# Patient Record
Sex: Female | Born: 1960 | Race: White | Hispanic: No | Marital: Married | Smoking: Former smoker
Health system: Southern US, Community
[De-identification: ages and names within clinical notes are randomized; demographics above are authoritative.]

## PROBLEM LIST (undated history)

## (undated) DIAGNOSIS — K635 Polyp of colon: Secondary | ICD-10-CM

## (undated) DIAGNOSIS — J309 Allergic rhinitis, unspecified: Secondary | ICD-10-CM

## (undated) DIAGNOSIS — E785 Hyperlipidemia, unspecified: Secondary | ICD-10-CM

## (undated) HISTORY — DX: Hyperlipidemia, unspecified: E78.5

## (undated) HISTORY — PX: POLYPECTOMY: SHX149

## (undated) HISTORY — DX: Allergic rhinitis, unspecified: J30.9

## (undated) HISTORY — PX: OOPHORECTOMY: SHX86

## (undated) HISTORY — PX: CYSTECTOMY: SUR359

## (undated) HISTORY — DX: Polyp of colon: K63.5

## (undated) HISTORY — PX: OTHER SURGICAL HISTORY: SHX169

## (undated) HISTORY — PX: COLONOSCOPY: SHX174

---

## 2000-12-30 ENCOUNTER — Other Ambulatory Visit: Admission: RE | Admit: 2000-12-30 | Discharge: 2000-12-30 | Payer: Self-pay | Admitting: Obstetrics and Gynecology

## 2001-09-03 ENCOUNTER — Encounter: Payer: Self-pay | Admitting: Internal Medicine

## 2001-09-03 ENCOUNTER — Encounter: Admission: RE | Admit: 2001-09-03 | Discharge: 2001-09-03 | Payer: Self-pay | Admitting: Internal Medicine

## 2001-10-10 ENCOUNTER — Encounter (INDEPENDENT_AMBULATORY_CARE_PROVIDER_SITE_OTHER): Payer: Self-pay | Admitting: Specialist

## 2001-10-10 ENCOUNTER — Observation Stay (HOSPITAL_COMMUNITY): Admission: AD | Admit: 2001-10-10 | Discharge: 2001-10-11 | Payer: Self-pay | Admitting: Obstetrics and Gynecology

## 2001-10-10 ENCOUNTER — Encounter: Payer: Self-pay | Admitting: Emergency Medicine

## 2001-10-10 ENCOUNTER — Encounter: Payer: Self-pay | Admitting: Obstetrics and Gynecology

## 2002-04-04 ENCOUNTER — Other Ambulatory Visit: Admission: RE | Admit: 2002-04-04 | Discharge: 2002-04-04 | Payer: Self-pay | Admitting: Obstetrics and Gynecology

## 2002-09-06 ENCOUNTER — Encounter: Admission: RE | Admit: 2002-09-06 | Discharge: 2002-09-06 | Payer: Self-pay | Admitting: Obstetrics and Gynecology

## 2002-09-06 ENCOUNTER — Encounter: Payer: Self-pay | Admitting: Obstetrics and Gynecology

## 2003-05-17 ENCOUNTER — Other Ambulatory Visit: Admission: RE | Admit: 2003-05-17 | Discharge: 2003-05-17 | Payer: Self-pay | Admitting: Obstetrics and Gynecology

## 2004-08-19 ENCOUNTER — Other Ambulatory Visit: Admission: RE | Admit: 2004-08-19 | Discharge: 2004-08-19 | Payer: Self-pay | Admitting: Obstetrics and Gynecology

## 2005-08-20 ENCOUNTER — Other Ambulatory Visit: Admission: RE | Admit: 2005-08-20 | Discharge: 2005-08-20 | Payer: Self-pay | Admitting: Obstetrics and Gynecology

## 2007-11-29 ENCOUNTER — Encounter: Admission: RE | Admit: 2007-11-29 | Discharge: 2007-11-29 | Payer: Self-pay | Admitting: Internal Medicine

## 2007-12-03 ENCOUNTER — Encounter: Admission: RE | Admit: 2007-12-03 | Discharge: 2007-12-03 | Payer: Self-pay | Admitting: Internal Medicine

## 2008-12-04 ENCOUNTER — Encounter: Admission: RE | Admit: 2008-12-04 | Discharge: 2008-12-04 | Payer: Self-pay | Admitting: Obstetrics and Gynecology

## 2008-12-08 ENCOUNTER — Encounter: Admission: RE | Admit: 2008-12-08 | Discharge: 2008-12-08 | Payer: Self-pay | Admitting: Obstetrics and Gynecology

## 2010-02-05 ENCOUNTER — Encounter: Admission: RE | Admit: 2010-02-05 | Discharge: 2010-02-05 | Payer: Self-pay | Admitting: Obstetrics and Gynecology

## 2010-08-04 ENCOUNTER — Encounter: Payer: Self-pay | Admitting: Internal Medicine

## 2010-08-05 ENCOUNTER — Encounter: Payer: Self-pay | Admitting: Obstetrics and Gynecology

## 2010-11-29 NOTE — H&P (Signed)
[facility] of [facility]  Patient:    Miranda Guerra, Miranda Guerra Visit Number: [id number] MRN: [id number]          Service Type: GYN Attending Physician:  Jaymes Graff A Dictated by:   Pierre Bali Normand Sloop, M.D. Admit Date:  10/10/2001 Discharge Date: 10/11/2001                           History and Physical  DATE OF BIRTH:                05/11/1961.  HISTORY OF PRESENT ILLNESS:   The patient is a 50 year old female, G0, last menstrual period was October 02, 2001 who presented to maternity admissions after [facility] secondary to right flank pain that is intermittent, what felt like a kidney stone to her.  The patient says that she has had kidney stones before and has been taking some oral calcium and there was an increased chance.  She said the pain is intermittent and has been having several over the last two days.  Denies any UTI symptoms.  No nausea, vomiting, fever, or chills.  The pain was relieved by Dilaudid given at [facility].  PAST MEDICAL HISTORY:         Unremarkable.  PAST SURGICAL HISTORY:        Unremarkable.  ALLERGIES:                    No known drug allergies.  PAST GYNECOLOGIC HISTORY:     Significant for menarche at age 19 occurring every month, lasting for three to four days.  She has no history of sexually transmitted disease or abnormal Pap smear.  The patient is not currently sexually active.  FAMILY HISTORY:               Significant for breast cancer with a maternal aunt and hypertension in her mother.  SOCIAL HISTORY:               Negative for tobacco, occasional alcohol, and no drug use.  PHYSICAL EXAMINATION:  VITAL SIGNS:                  Temperature is 98.1, respiratory rate is 16, blood pressure is 124/79, pulse is 70.  GENERAL:                      The patient is in no apparent distress.  HEART:                        Regular.  LUNGS:                        Clear.  ABDOMEN:                       Soft and nontender.  BACK:                         She does have some right CVA tenderness.  No left CVA tenderness.  PELVIC:                       Her vulvovaginal is within normal limits.  Her cervix is normal in appearance without any lesions.  No cervical motion tenderness.  Uterus is normal size and nontender.  She does have some right adnexal fullness and tenderness.  Left adnexa is nontender.  EXTREMITIES:                  No clubbing, cyanosis, or edema bilaterally.  LABORATORY DATA:              Her UA is negative.  Urine HCG is negative.  CT of the abdomen and pelvis show no kidney stones but there was a large ovarian cyst that was consistent with a dermoid measuring 4.5 by 6.4 cm and her ovary was slightly enlarged measuring 3.3 cm by 6 cm.  Left ovary was normal and that is the report from the fellow at [facility].  ASSESSMENT/PLAN:              The patient has a right adnexal mass, possible ovarian torsion.  The patient is hesitant about proceeding with surgery.  The risks of ovarian torsion I explained to the patient that she may lose her ovary.  The patient then decided that she would agree to an ultrasound to see if there is any doppler flow to the ovary and to get more detail about the ovary cyst.  The patient agreed with the plan and we will re-evaluate the situation after the ultrasound. Dictated by:   Pierre Bali. Normand Sloop, M.D. Attending Physician:  Michael Litter DD:  10/10/01 TD:  10/10/01 Job: [id number]

## 2010-11-29 NOTE — Op Note (Signed)
[facility] of [facility]  Patient:    Miranda Guerra, Miranda Guerra Visit Number: [id number] MRN: [id number]          Service Type: OBS Location: [id number] Attending Physician:  Jaymes Graff A Dictated by:   Pierre Bali Normand Sloop, M.D. Proc. Date: 10/10/01 Admit Date:  10/10/2001                             Operative Report  PREOPERATIVE DIAGNOSIS:       Ovarian cyst with intermittent torsion.  POSTOPERATIVE DIAGNOSIS:      Torsed right ovary and tube.  OPERATION:                    Laparoscopic right salpingo-oophorectomy and lysis of adhesions.  ANESTHESIA:                   General endotracheal tube.  SURGEON:                      Naima A. Normand Sloop, M.D.  ASSISTANT:                    Janine Limbo, M.D.  ESTIMATED BLOOD LOSS:         Minimal.  INTRAVENOUS FLUIDS:           2000 cc crystalloid.  URINE OUTPUT:                 150 cc clear urine.  FINDINGS:                     Normal sized uterus and left tube and ovary, normal-appearing. The patient had a large right torsed ovary and tube. They were both hemorrhagic and necrotic and edematous. There was also a large dermoid cyst attached to the necrotic ovary. The entire complex measured 7 to 8 cm. She had normal-appearing abdominal anatomy, normal appendix. There was a left sigmoid adhesion to a left uterosacral ligament, and patient went to recovery room in stable condition.  PATIENT IDENTIFICATION:       The patient was taken to the operating room where she was given general anesthesia, placed in dorsal lithotomy position, and prepped and draped in a normal sterile fashion. Foley catheter was placed in the bladder. A bivalve speculum was placed into the vagina. The anterior lip of the cervix was grasped with a single-tooth tenaculum. The ______ was placed on the ______ to manipulate the uterus. Attention was then turned to the patients abdomen where a 10-mm infraumbilical vertical incision was made with  the knife and Veress needle was placed at a 45-degree angle while tenting the abdominal wall. Intraabdominal placement was confirmed by fluid-filled syringe and decrease in CO2 pressure. Abdomen was insufflated about 3.5 L, CO2 gas. A 10-mm trocar was placed without difficulty and the findings noted above were seen. A total of 3 cc of Marcaine was placed in the right lower quadrant and a 5-mm trocar was placed under direct visualization with a laparoscope. Pelvic washings were then obtained. A total of 3 cc Marcaine were placed in the left lower quadrant and the 5-mm trocar was placed under the direct visualization of the laparoscope. Hemostasis were assured. Pelvic washings were obtained. The ureter on the right side was found to be far enough away from the infundibulopelvic ligament to dissect the right tube from the ovary and there were no adhesions in the  way. The ovary was then un-torsed; it still was necrotic, needed to be removed. The patients round ligament was grasped, cauterized and cut, then the uteroovarian ligaments were grasped, cauterized, and cut. The ureter again was looked to make sure that it was out of the way. Two Endoloops were placed on the infundibulopelvic ligament, and they were ligated x2. The ovary and tube were then cut away using scissors. The ureter was noted to be intact. The 5-mm trocar was then extended on the left to a 10-mm site and the 10-mm trocar was placed. EndoCatch bag was placed through this trocar. The large ovarian tubal complex was placed into the bag. The bag was then drained with a syringe of thick fluid consistent with a sebaceous material found in a dermoid cyst. The cyst was shrunk enough to place the whole entire bag over the entire complex, and the cyst was then removed through the 10-mm port. The fascia then had to be extended with the knife and hemostasis was noted. With careful traction, the cyst was removed. There was no drainage into  the abdomen. The 10-mm port fascia was cleared with 0 Vicryl in a running fashion. Upon second look, there was no sebaceous material, no spillage from the tuboovarian complex into the pelvis. The pelvis was irrigated and then drained. The infundibulopelvic ligament and the round ligament pedicles were noted to be hemostatic. She had a small adhesion going from the sigmoid colon to left uterosacral ligament, which was cauterized then cut. There was no damage noted to the bowel from the cautery or cutting. All instruments were removed under direct visualization with the laparoscope as the air was allowed to leave the abdomen. The fascia and the umbilical port was repaired with 0 Vicryl. The skin was all repaired with 3-0 Vicryl in a subcuticular fashion. The tenaculum, ______ was removed with good hemostasis noted. Sponge, lap and needle counts were correct x2. The patient went to recovery room in stable condition.  Of note, the cyst had the exact appearance of a dermoid. There was no omental study noted. There was no excrescences. There was no indication that this is a malignant tumor. Dictated by:   Pierre Bali. Normand Sloop, M.D. Attending Physician:  Michael Litter DD:  10/10/01 TD:  10/11/01 Job: [id number]

## 2011-02-17 ENCOUNTER — Other Ambulatory Visit: Payer: Self-pay | Admitting: Obstetrics and Gynecology

## 2011-02-17 DIAGNOSIS — Z1231 Encounter for screening mammogram for malignant neoplasm of breast: Secondary | ICD-10-CM

## 2011-02-26 ENCOUNTER — Ambulatory Visit
Admission: RE | Admit: 2011-02-26 | Discharge: 2011-02-26 | Disposition: A | Discharge status: Home / Self Care | Payer: BC Managed Care – PPO | Source: Ambulatory Visit | Attending: Obstetrics and Gynecology | Admitting: Obstetrics and Gynecology

## 2011-02-26 DIAGNOSIS — Z1231 Encounter for screening mammogram for malignant neoplasm of breast: Secondary | ICD-10-CM

## 2012-02-16 ENCOUNTER — Other Ambulatory Visit: Payer: Self-pay | Admitting: Obstetrics and Gynecology

## 2012-02-16 DIAGNOSIS — Z1231 Encounter for screening mammogram for malignant neoplasm of breast: Secondary | ICD-10-CM

## 2012-03-12 ENCOUNTER — Ambulatory Visit: Payer: BC Managed Care – PPO

## 2012-04-01 ENCOUNTER — Ambulatory Visit
Admission: RE | Admit: 2012-04-01 | Discharge: 2012-04-01 | Disposition: A | Discharge status: Home / Self Care | Payer: BC Managed Care – PPO | Source: Ambulatory Visit | Attending: Obstetrics and Gynecology | Admitting: Obstetrics and Gynecology

## 2012-04-01 DIAGNOSIS — Z1231 Encounter for screening mammogram for malignant neoplasm of breast: Secondary | ICD-10-CM

## 2012-04-06 ENCOUNTER — Encounter: Payer: Self-pay | Admitting: Obstetrics and Gynecology

## 2012-06-01 ENCOUNTER — Encounter: Payer: Self-pay | Admitting: Gastroenterology

## 2012-06-14 ENCOUNTER — Encounter: Payer: Self-pay | Admitting: Obstetrics and Gynecology

## 2012-06-14 ENCOUNTER — Ambulatory Visit (INDEPENDENT_AMBULATORY_CARE_PROVIDER_SITE_OTHER): Payer: BC Managed Care – PPO | Admitting: Obstetrics and Gynecology

## 2012-06-14 VITALS — BP 110/62 | Ht 67.0 in | Wt 170.0 lb

## 2012-06-14 DIAGNOSIS — Z01419 Encounter for gynecological examination (general) (routine) without abnormal findings: Secondary | ICD-10-CM

## 2012-06-14 NOTE — Progress Notes (Signed)
Subjective:    Miranda Guerra is a 51 y.o. female G0P0 who presents for annual exam. The patient complaints of weight gain.  The following portions of the patient's history were reviewed and updated as appropriate: allergies, current medications, past family history, past medical history, past social history, past surgical history and problem list.  Review of Systems Pertinent items are noted in HPI. Gastrointestinal:No change in bowel habits, no abdominal pain, no rectal bleeding Genitourinary:negative for dysuria, frequency, hematuria, nocturia and urinary incontinence    Objective:     BP 110/62  Ht 5\' 7"  (1.702 m)  Wt 170 lb (77.111 kg)  BMI 26.63 kg/m2  LMP 05/27/2012  Weight:  Wt Readings from Last 1 Encounters:  06/14/12 170 lb (77.111 kg)     BMI: Body mass index is 26.63 kg/(m^2). General Appearance: Alert, appropriate appearance for age. No acute distress HEENT: Grossly normal Neck / Thyroid: Supple, no masses, nodes or enlargement Lungs: clear to auscultation bilaterally Back: No CVA tenderness Breast Exam: No masses or nodes.No dimpling, nipple retraction or discharge. Cardiovascular: Regular rate and rhythm. S1, S2, no murmur Gastrointestinal: Soft, non-tender, no masses or organomegaly  ++++++++++++++++++++++++++++++++++++++++++++++++++++++++  Pelvic Exam: External genitalia: normal general appearance Vaginal: normal without tenderness, induration or masses Cervix: normal appearance Adnexa: normal bimanual exam Uterus: normal size shape and consistency Rectovaginal: normal rectal, no masses  ++++++++++++++++++++++++++++++++++++++++++++++++++++++++  Lymphatic Exam: Non-palpable nodes in neck, clavicular, axillary, or inguinal regions  Psychiatric: Alert and oriented, appropriate affect.      Assessment:    Normal gyn exam   Overweight or obese: Yes  Pelvic relaxation: No  Menopausal symptoms: No. Severe: No.   Plan:    Pap smear.   I  recommended the patient see a nutritionist.  Follow-up:  for annual exam  The updated Pap smear screening guidelines were discussed with the patient. The patient requested that I obtain a Pap smear: Yes.  Kegel exercises discussed: Yes.  Proper diet and regular exercise were reviewed.  Annual mammograms recommended starting at age 87. Proper breast care was discussed.  Screening colonoscopy is recommended beginning at age 41.  Regular health maintenance was reviewed.  Sleep hygiene was discussed.  Adequate calcium and vitamin D intake was emphasized.  Leonard Schwartz M.D.   Regular Periods: yes Mammogram: yes  Monthly Breast Ex.: no Exercise: yes  Tetanus < 10 years: yes Seatbelts: yes  NI. Bladder Functn.: yes Abuse at home: no  Daily BM's: yes Stressful Work: yes  Healthy Diet: yes Sigmoid-Colonoscopy: never had one   Calcium: yes Medical problems this year: none   LAST PAP:04/24/09 wnl   Contraception: None  Mammogram:  02/17/2012  PCP: [facility]   JWJ:[id number]   FMH: unchanged

## 2012-06-15 LAB — PAP IG W/ RFLX HPV ASCU

## 2012-07-20 ENCOUNTER — Encounter: Payer: BC Managed Care – PPO | Admitting: Gastroenterology

## 2012-09-01 ENCOUNTER — Ambulatory Visit (AMBULATORY_SURGERY_CENTER): Payer: BC Managed Care – PPO | Admitting: *Deleted

## 2012-09-01 VITALS — Ht 68.0 in | Wt 173.2 lb

## 2012-09-01 DIAGNOSIS — Z1211 Encounter for screening for malignant neoplasm of colon: Secondary | ICD-10-CM

## 2012-09-01 MED ORDER — NA SULFATE-K SULFATE-MG SULF 17.5-3.13-1.6 GM/177ML PO SOLN: 1.0000 | Freq: Once | ORAL | Status: DC

## 2012-09-01 NOTE — Progress Notes (Signed)
No egg or soy allergy. No problems with sedation in the past. ewm 

## 2012-09-02 ENCOUNTER — Encounter: Payer: Self-pay | Admitting: Gastroenterology

## 2012-09-10 ENCOUNTER — Ambulatory Visit (AMBULATORY_SURGERY_CENTER): Payer: BC Managed Care – PPO | Admitting: Gastroenterology

## 2012-09-10 ENCOUNTER — Encounter: Payer: Self-pay | Admitting: Gastroenterology

## 2012-09-10 VITALS — BP 122/74 | HR 70 | Temp 98.6°F | Resp 21 | Ht 68.0 in | Wt 178.0 lb

## 2012-09-10 DIAGNOSIS — Z1211 Encounter for screening for malignant neoplasm of colon: Secondary | ICD-10-CM

## 2012-09-10 DIAGNOSIS — D126 Benign neoplasm of colon, unspecified: Secondary | ICD-10-CM

## 2012-09-10 DIAGNOSIS — K5730 Diverticulosis of large intestine without perforation or abscess without bleeding: Secondary | ICD-10-CM

## 2012-09-10 DIAGNOSIS — C187 Malignant neoplasm of sigmoid colon: Secondary | ICD-10-CM

## 2012-09-10 MED ORDER — SODIUM CHLORIDE 0.9 % IV SOLN: 500.0000 mL | INTRAVENOUS | Status: DC

## 2012-09-10 NOTE — Progress Notes (Signed)
Patient did not have preoperative order for IV antibiotic SSI prophylaxis. (G8918)  Patient did not experience any of the following events: a burn prior to discharge; a fall within the facility; wrong site/side/patient/procedure/implant event; or a hospital transfer or hospital admission upon discharge from the facility. (G8907)  

## 2012-09-10 NOTE — Progress Notes (Signed)
Called to room to assist during endoscopic procedure.  Patient ID and intended procedure confirmed with present staff. Received instructions for my participation in the procedure from the performing physician.  

## 2012-09-10 NOTE — Patient Instructions (Addendum)
YOU HAD AN ENDOSCOPIC PROCEDURE TODAY AT THE  ENDOSCOPY CENTER: Refer to the procedure report that was given to you for any specific questions about what was found during the examination.  If the procedure report does not answer your questions, please call your gastroenterologist to clarify.  If you requested that your care partner not be given the details of your procedure findings, then the procedure report has been included in a sealed envelope for you to review at your convenience later.  YOU SHOULD EXPECT: Some feelings of bloating in the abdomen. Passage of more gas than usual.  Walking can help get rid of the air that was put into your GI tract during the procedure and reduce the bloating. If you had a lower endoscopy (such as a colonoscopy or flexible sigmoidoscopy) you may notice spotting of blood in your stool or on the toilet paper. If you underwent a bowel prep for your procedure, then you may not have a normal bowel movement for a few days.  DIET: Your first meal following the procedure should be a light meal and then it is ok to progress to your normal diet.  A half-sandwich or bowl of soup is an example of a good first meal.  Heavy or fried foods are harder to digest and may make you feel nauseous or bloated.  Likewise meals heavy in dairy and vegetables can cause extra gas to form and this can also increase the bloating.  Drink plenty of fluids but you should avoid alcoholic beverages for 24 hours.  ACTIVITY: Your care partner should take you home directly after the procedure.  You should plan to take it easy, moving slowly for the rest of the day.  You can resume normal activity the day after the procedure however you should NOT DRIVE or use heavy machinery for 24 hours (because of the sedation medicines used during the test).    SYMPTOMS TO REPORT IMMEDIATELY: A gastroenterologist can be reached at any hour.  During normal business hours, 8:30 AM to 5:00 PM Monday through Friday,  call [phone].  After hours and on weekends, please call the GI answering service at [phone] who will take a message and have the physician on call contact you.   Following lower endoscopy (colonoscopy or flexible sigmoidoscopy):  Excessive amounts of blood in the stool  Significant tenderness or worsening of abdominal pains  Swelling of the abdomen that is new, acute  Fever of 100F or higher   FOLLOW UP: If any biopsies were taken you will be contacted by phone or by letter within the next 1-3 weeks.  Call your gastroenterologist if you have not heard about the biopsies in 3 weeks.  Our staff will call the home number listed on your records the next business day following your procedure to check on you and address any questions or concerns that you may have at that time regarding the information given to you following your procedure. This is a courtesy call and so if there is no answer at the home number and we have not heard from you through the emergency physician on call, we will assume that you have returned to your regular daily activities without incident.  SIGNATURES/CONFIDENTIALITY: You and/or your care partner have signed paperwork which will be entered into your electronic medical record.  These signatures attest to the fact that that the information above on your After Visit Summary has been reviewed and is understood.  Full responsibility of the confidentiality of   this discharge information lies with you and/or your care-partner.   INFORMATION ON POLYPS GIVEN TO YOU TODAY 

## 2012-09-10 NOTE — Op Note (Signed)
[facility] [address] [place], [address]   COLONOSCOPY PROCEDURE REPORT  PATIENT: Miranda, Guerra  MR#: [id number] BIRTHDATE: [birth year] , 51  yrs. old GENDER: Female ENDOSCOPIST: Louis Meckel, MD REFERRED [id number] Cleda Clarks, M.D. PROCEDURE DATE:  09/10/2012 PROCEDURE:   Colonoscopy with snare polypectomy and Submucosal injection, any substance ASA CLASS:   Class I INDICATIONS:average risk screening. MEDICATIONS: MAC sedation, administered by CRNA and Propofol (Diprivan) 280 mg IV  DESCRIPTION OF PROCEDURE:   After the risks benefits and alternatives of the procedure were thoroughly explained, informed consent was obtained.  A digital rectal exam revealed no abnormalities of the rectum.   The LB [id number]  endoscope was introduced through the anus and advanced to the cecum, which was identified by both the appendix and ileocecal valve. No adverse events experienced.   The quality of the prep was Suprep excellent The instrument was then slowly withdrawn as the colon was fully examined.      COLON FINDINGS: A flat polyp measuring 5 mm in size was found in the ascending colon.  Cold polypectomy  was performed in a piecemeal fashion.    The resection was complete and the polyp tissue was completely retrieved.   A sessile polyp measuring 4 mm in size was found in the descending colon.  A polypectomy was performed with a cold snare.  The resection was complete and the polyp tissue was completely retrieved.   A In the sigmoid colon at approximately 18 cm there was a semi-pedunculated friable, bleeding polyp.  This was removed with hot polypectomy snare and submitted to pathology.  The surrounding area was injected with 2 cc of "spot", submucosally. polyp was found.   Mild diverticulosis was noted in the sigmoid colon.   The colon mucosa was otherwise normal.  Retroflexed views revealed no abnormalities. The time to cecum=3 minutes 37  seconds. Withdrawal time=13 minutes 54 seconds.  The scope was withdrawn and the procedure completed. COMPLICATIONS: There were no complications.  ENDOSCOPIC IMPRESSION: 1.   colon polyps 2.  diverticulosis  RECOMMENDATIONS: If the polyp(s) removed today are proven to be adenomatous (pre-cancerous) polyps, you will need a colonoscopy in 3 years. Otherwise you should continue to follow colorectal cancer screening guidelines for "routine risk" patients with a colonoscopy in 10 years.  You will receive a letter within 1-2 weeks with the results of your biopsy as well as final recommendations.  Please call my office if you have not received a letter after 3 weeks.   eSigned:  Louis Meckel, MD 09/10/2012 10:42 AM   cc:   PATIENT NAME:  Miranda, Guerra MR#: [id number]

## 2012-09-10 NOTE — Progress Notes (Signed)
A/ox3 pleased with MAC report to Penny RN 

## 2012-09-13 ENCOUNTER — Telehealth: Payer: Self-pay | Admitting: *Deleted

## 2012-09-13 NOTE — Telephone Encounter (Signed)
No answer, message left for the patient. 

## 2012-09-22 ENCOUNTER — Telehealth: Payer: Self-pay | Admitting: Gastroenterology

## 2012-09-22 NOTE — Telephone Encounter (Signed)
Left message for pt to call back  °

## 2012-09-22 NOTE — Telephone Encounter (Signed)
Jesusita Oka, Thanks for your assistance.  Rob

## 2012-09-22 NOTE — Telephone Encounter (Signed)
Miranda Guerra, We discussed her case today at multidisciplinary cancer conference.  Surgeons, path, medical oncologists all agreed that she has had definitive therapy for the small cancer within polyp and she does not require further resection.  Close endoscopic surveillance recommended.  Thanks

## 2012-09-22 NOTE — Telephone Encounter (Signed)
Message copied by Chrystie Nose on Wed Sep 22, 2012  9:27 AM ------      Message from: Melvia Heaps D      Created: Wed Sep 22, 2012  9:20 AM       Please ask patient to schedule an office visit to discuss colonoscopy results. Indicated to her that everything is okay but we just need to talk about followup. ------

## 2012-09-23 ENCOUNTER — Telehealth: Payer: Self-pay

## 2012-09-23 NOTE — Telephone Encounter (Signed)
Please ask patient to schedule an office visit to discuss colonoscopy results. Indicated to her that everything is okay but we just need to talk about followup.  ------      Louis Meckel, MD at 09/22/2012 9:19 AM   Pt aware, she knows she needs a follow-up to discuss followup per Dr. Arlyce Dice. Pt scheduled to see Dr. Arlyce Dice 10/01/12@9 :30am. Pt aware of appt date and time.

## 2012-10-01 ENCOUNTER — Encounter: Payer: Self-pay | Admitting: Gastroenterology

## 2012-10-01 ENCOUNTER — Ambulatory Visit (INDEPENDENT_AMBULATORY_CARE_PROVIDER_SITE_OTHER): Payer: BC Managed Care – PPO | Admitting: Gastroenterology

## 2012-10-01 VITALS — BP 110/70 | HR 70 | Wt 171.0 lb

## 2012-10-01 DIAGNOSIS — C187 Malignant neoplasm of sigmoid colon: Secondary | ICD-10-CM

## 2012-10-01 DIAGNOSIS — Z85038 Personal history of other malignant neoplasm of large intestine: Secondary | ICD-10-CM | POA: Insufficient documentation

## 2012-10-01 NOTE — Assessment & Plan Note (Addendum)
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 Case was discussed at tumor conference where the consensus was that polypectomy is sufficient therapy. Surgical resection was not recommended. Because of the pathology increased surveillance will be done, namely, sigmoidoscopy in 3 and in 6 months, and full colonoscopy in one year. This was fully explained to the patient who is in agreement with the proposed surveillance recommendations.

## 2012-10-01 NOTE — Progress Notes (Signed)
History of Present Illness: Pleasant 52 year old white female here following colonoscopy for screening where multiple colon polyps were seen and removed. 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 There was a 1 mm margin between the cancer and the resected tissue. She has no GI complaints. Family history is negative for colon cancer. Father has had recurrent colon polyps.    Past Medical History  Diagnosis Date  . Hyperlipidemia   . Colon polyps   . Allergic rhinitis    Past Surgical History  Procedure Laterality Date  . Cystectomy    . Fallopian tube removal    . Oophorectomy     family history includes Alzheimer's disease in her mother; Breast cancer in her maternal aunt and sister; Colon polyps in her father; Emphysema in her maternal grandfather and paternal grandfather; Heart disease in her maternal grandfather; Hypertension in her mother; and Lung cancer in her paternal grandmother.  There is no history of Colon cancer. Current Outpatient Prescriptions  Medication Sig Dispense Refill  . cholecalciferol (VITAMIN D) 1000 UNITS tablet Take 1,000 Units by mouth 2 (two) times daily.      . fluticasone (FLONASE) 50 MCG/ACT nasal spray Place 2 sprays into the nose daily.      . vitamin E (VITAMIN E) 400 UNIT capsule Take 400 Units by mouth daily.       No current facility-administered medications for this visit.   Allergies as of 10/01/2012  . (No Known Allergies)    reports that she quit smoking about 25 years ago. She has never used smokeless tobacco. She reports that she drinks about 2.4 ounces of alcohol per week. She reports that she does not use illicit drugs.     Review of Systems: Pertinent positive and negative review of systems were noted in the above HPI section. All other review of systems were otherwise negative.  Vital signs were reviewed in today's medical record Physical  Exam: General: Well developed , well nourished, no acute distress Skin: anicteric Head: Normocephalic and atraumatic Eyes:  sclerae anicteric, EOMI Ears: Normal auditory acuity Mouth: No deformity or lesions Neck: Supple, no masses or thyromegaly Lungs: Clear throughout to auscultation Heart: Regular rate and rhythm; no murmurs, rubs or bruits Abdomen: Soft, non tender and non distended. No masses, hepatosplenomegaly or hernias noted. Normal Bowel sounds Rectal:deferred Musculoskeletal: Symmetrical with no gross deformities  Skin: No lesions on visible extremities Pulses:  Normal pulses noted Extremities: No clubbing, cyanosis, edema or deformities noted Neurological: Alert oriented x 4, grossly nonfocal Cervical Nodes:  No significant cervical adenopathy Inguinal Nodes: No significant inguinal adenopathy Psychological:  Alert and cooperative. Normal mood and affect

## 2012-10-01 NOTE — Patient Instructions (Addendum)
You will be due for a Flexible Sigmoidoscopy in 3 and again in 6 months We will call you with a reminder when to schedule

## 2012-12-03 ENCOUNTER — Encounter: Payer: Self-pay | Admitting: Gastroenterology

## 2012-12-13 ENCOUNTER — Encounter: Payer: Self-pay | Admitting: Gastroenterology

## 2012-12-16 ENCOUNTER — Ambulatory Visit (AMBULATORY_SURGERY_CENTER): Payer: BC Managed Care – PPO | Admitting: *Deleted

## 2012-12-16 ENCOUNTER — Encounter: Payer: Self-pay | Admitting: Gastroenterology

## 2012-12-16 VITALS — Ht 68.0 in | Wt 170.8 lb

## 2012-12-16 DIAGNOSIS — C187 Malignant neoplasm of sigmoid colon: Secondary | ICD-10-CM

## 2012-12-16 NOTE — Progress Notes (Signed)
No egg or soy allergy. ewm No problems with sedation in the past. ewm No home oxygen use/ ewm  

## 2012-12-28 ENCOUNTER — Other Ambulatory Visit: Payer: BC Managed Care – PPO | Admitting: Gastroenterology

## 2012-12-28 ENCOUNTER — Encounter: Payer: Self-pay | Admitting: Gastroenterology

## 2012-12-28 ENCOUNTER — Ambulatory Visit (AMBULATORY_SURGERY_CENTER): Payer: BC Managed Care – PPO | Admitting: Gastroenterology

## 2012-12-28 VITALS — BP 117/73 | HR 65 | Temp 98.8°F | Resp 29 | Ht 68.0 in | Wt 170.0 lb

## 2012-12-28 DIAGNOSIS — C187 Malignant neoplasm of sigmoid colon: Secondary | ICD-10-CM

## 2012-12-28 DIAGNOSIS — K5730 Diverticulosis of large intestine without perforation or abscess without bleeding: Secondary | ICD-10-CM | POA: Diagnosis not present

## 2012-12-28 DIAGNOSIS — D126 Benign neoplasm of colon, unspecified: Secondary | ICD-10-CM

## 2012-12-28 MED ORDER — SODIUM CHLORIDE 0.9 % IV SOLN: 500.0000 mL | INTRAVENOUS | Status: DC

## 2012-12-28 NOTE — Progress Notes (Signed)
I encouraged the pt to pass flatus in the recovery room when she came in.  She said, "I don't do that in public".  I encouraged her agin and explained why she needed to. Maw

## 2012-12-28 NOTE — Progress Notes (Signed)
Patient did not experience any of the following events: a burn prior to discharge; a fall within the facility; wrong site/side/patient/procedure/implant event; or a hospital transfer or hospital admission upon discharge from the facility. (G8907) Patient did not have preoperative order for IV antibiotic SSI prophylaxis. (G8918)  

## 2012-12-28 NOTE — Progress Notes (Signed)
Report to pacu rn, vss, bbs=clear 

## 2012-12-28 NOTE — Patient Instructions (Addendum)
Handouts were given to your care partner on polyps, diverticulosis and a high fiber diet.  Reapt flexible sigmoidoscopy for 3 monthes on 04-01-13 @ 0830.  You have a previsit on 03-18-13 at 08:00.  [employer] card and a list of medications.  Please call if any questions or concerns.  YOU HAD AN ENDOSCOPIC PROCEDURE TODAY AT THE [facility]: Refer to the procedure report that was given to you for any specific questions about what was found during the examination.  If the procedure report does not answer your questions, please call your gastroenterologist to clarify.  If you requested that your care partner not be given the details of your procedure findings, then the procedure report has been included in a sealed envelope for you to review at your convenience later.  YOU SHOULD EXPECT: Some feelings of bloating in the abdomen. Passage of more gas than usual.  Walking can help get rid of the air that was put into your GI tract during the procedure and reduce the bloating. If you had a lower endoscopy (such as a colonoscopy or flexible sigmoidoscopy) you may notice spotting of blood in your stool or on the toilet paper. If you underwent a bowel prep for your procedure, then you may not have a normal bowel movement for a few days.  DIET: Your first meal following the procedure should be a light meal and then it is ok to progress to your normal diet.  A half-sandwich or bowl of soup is an example of a good first meal.  Heavy or fried foods are harder to digest and may make you feel nauseous or bloated.  Likewise meals heavy in dairy and vegetables can cause extra gas to form and this can also increase the bloating.  Drink plenty of fluids but you should avoid alcoholic beverages for 24 hours.  ACTIVITY: Your care partner should take you home directly after the procedure.  You should plan to take it easy, moving slowly for the rest of the day.  You can resume normal activity the day after the  procedure however you should NOT DRIVE or use heavy machinery for 24 hours (because of the sedation medicines used during the test).    SYMPTOMS TO REPORT IMMEDIATELY: A gastroenterologist can be reached at any hour.  During normal business hours, 8:30 AM to 5:00 PM Monday through Friday, call [phone].  After hours and on weekends, please call the GI answering service at [phone] who will take a message and have the physician on call contact you.   Following lower endoscopy (colonoscopy or flexible sigmoidoscopy):  Excessive amounts of blood in the stool  Significant tenderness or worsening of abdominal pains  Swelling of the abdomen that is new, acute  Fever of 100F or higher    FOLLOW UP: If any biopsies were taken you will be contacted by phone or by letter within the next 1-3 weeks.  Call your gastroenterologist if you have not heard about the biopsies in 3 weeks.  Our staff will call the home number listed on your records the next business day following your procedure to check on you and address any questions or concerns that you may have at that time regarding the information given to you following your procedure. This is a courtesy call and so if there is no answer at the home number and we have not heard from you through the emergency physician on call, we will assume that you have returned to your  regular daily activities without incident.  SIGNATURES/CONFIDENTIALITY: You and/or your care partner have signed paperwork which will be entered into your electronic medical record.  These signatures attest to the fact that that the information above on your After Visit Summary has been reviewed and is understood.  Full responsibility of the confidentiality of this discharge information lies with you and/or your care-partner.

## 2012-12-28 NOTE — Progress Notes (Signed)
Note, Flex sigmoid as procedure

## 2012-12-28 NOTE — Op Note (Signed)
[facility] [address] [place], [address]   FLEXIBLE SIGMOIDOSCOPY PROCEDURE REPORT  PATIENT: Miranda Guerra, Miranda Guerra  MR#: [id number] BIRTHDATE: [birth year] , 51  yrs. old GENDER: Female ENDOSCOPIST: Louis Meckel, MD REFERRED BY: Theadora Rama, M.D. PROCEDURE DATE:  12/28/2012 PROCEDURE:   Sigmoidoscopy with snare ASA CLASS:   Class I INDICATIONS:high risk patient with personal history of colon cancer. (malignant polyp) 2014 MEDICATIONS: MAC sedation, administered by CRNA and propofol (Diprivan) 200mg  IV  DESCRIPTION OF PROCEDURE:   After the risks benefits and alternatives of the procedure were thoroughly explained, informed consent was obtained.  revealed no abnormalities of the rectum. The LB PFC-H190 [id number]  endoscope was introduced through the anus  and advanced to the descending colon , limited by No adverse events experienced.   The quality of the prep was    .  The instrument was then slowly withdrawn as the mucosa was fully examined.       1.  COLON FINDINGS: A sessile polyp was found in the descending colon. A polypectomy was performed with a cold snare.  The resection was complete and the polyp tissue was completely retrieved. 2.  Mild diverticulosis was noted in the sigmoid colon. 3.  The colon mucosa was otherwise normal. Retroflexed views revealed no abnormalities.    The scope was then withdrawn from the patient and the procedure terminated.  COMPLICATIONS: There were no complications.  ENDOSCOPIC IMPRESSION: 1.   Sessile polyp was found in the descending colon; polypectomy was performed with a cold snare 2.   Mild diverticulosis was noted in the sigmoid colon 3.   The colon mucosa was otherwise normal  RECOMMENDATIONS: repeat sigmoidoscopy 3 months, full colonoscopy 9 months   REPEAT EXAM:   _______________________________ eSignedLouis Meckel, MD 12/28/2012 9:41 AM   CC:  PATIENT NAME:  Aneliz, Carbary MR#:  [id number]

## 2012-12-28 NOTE — Progress Notes (Signed)
Pt did pass flatus and abdomen is soft .  No complaints noted. Maw

## 2012-12-29 ENCOUNTER — Telehealth: Payer: Self-pay | Admitting: *Deleted

## 2012-12-29 NOTE — Telephone Encounter (Signed)
No answer. Left message to call if questions or concerns. 

## 2012-12-31 ENCOUNTER — Telehealth: Payer: Self-pay | Admitting: Gastroenterology

## 2013-01-03 NOTE — Telephone Encounter (Signed)
Late Entry  12/31/12   Pt called late to discuss her dx of Colon Ca. She states she was never told she had cancer and when she can in last week for a Flex Sig, the [facility] nurse mentioned it to her. Pt had a COLON on 09/10/12 and a bleeding polyp was found that proved to be cancer. Dr Arlyce Dice discussed the path with Dr Christella Hartigan who presented the pt to case conference and pt was called to come for an office visit on 09/22/12. Pt seen on 10/01/12 and according to [facility], was informed of the Cancer and that she needed repeat Flex Sigs in 3 and 6 months as surveillance for the cancerous polyp. Pt denies ever hearing this. She will call me back Monday, 01/03/13 to discuss this further.

## 2013-01-05 NOTE — Telephone Encounter (Signed)
lmom for pt to call back; she had called me earlier.

## 2013-01-05 NOTE — Telephone Encounter (Signed)
Discussed latest path with pt that is a tubular adenoma. Pt would like to come in and discuss her situation with Dr Arlyce Dice; pt given an appt on 01/19/13.

## 2013-01-10 ENCOUNTER — Telehealth: Payer: Self-pay | Admitting: Gastroenterology

## 2013-01-10 NOTE — Telephone Encounter (Signed)
i had a lengthy, in depth discussion of the pt's diagnosis and theraputic options.  She apparantly did not make the connection between a malignant or cancerous polyp and colon caner.  She was startled and caught off guard when told by a endo nurse  that she had colon caner.  All of patient's questions were answered.  I offered to facilitate a second opinion or change of physician and she indicated that she would like to continue in the present mode.  I will canel her next OV  Since these issues were fully vented on the phone.

## 2013-01-19 ENCOUNTER — Ambulatory Visit: Payer: BC Managed Care – PPO | Admitting: Gastroenterology

## 2013-01-20 ENCOUNTER — Encounter: Payer: Self-pay | Admitting: Gastroenterology

## 2013-03-07 ENCOUNTER — Other Ambulatory Visit: Payer: Self-pay

## 2013-03-07 DIAGNOSIS — Z1231 Encounter for screening mammogram for malignant neoplasm of breast: Secondary | ICD-10-CM

## 2013-03-16 ENCOUNTER — Telehealth: Payer: Self-pay

## 2013-03-16 NOTE — Telephone Encounter (Signed)
Pt called and requested her appts here be cancelled. Pt requested her records be faxed to Dr. Loreta Ave, she has decided to transfer her care. Let pt know that she must come to medical records and sign a release then her records can be faxed. Pt instructed to call back if she had any questions.

## 2013-03-18 ENCOUNTER — Encounter: Payer: Self-pay | Admitting: Gastroenterology

## 2013-04-01 ENCOUNTER — Other Ambulatory Visit: Payer: BC Managed Care – PPO | Admitting: Gastroenterology

## 2013-05-02 ENCOUNTER — Ambulatory Visit
Admission: RE | Admit: 2013-05-02 | Discharge: 2013-05-02 | Disposition: A | Discharge status: Home / Self Care | Payer: BC Managed Care – PPO | Source: Ambulatory Visit

## 2013-05-02 ENCOUNTER — Ambulatory Visit: Payer: BC Managed Care – PPO

## 2013-05-02 DIAGNOSIS — Z1231 Encounter for screening mammogram for malignant neoplasm of breast: Secondary | ICD-10-CM

## 2013-05-09 ENCOUNTER — Other Ambulatory Visit: Payer: Self-pay | Admitting: Obstetrics and Gynecology

## 2013-05-09 DIAGNOSIS — R928 Other abnormal and inconclusive findings on diagnostic imaging of breast: Secondary | ICD-10-CM

## 2013-05-19 ENCOUNTER — Ambulatory Visit
Admission: RE | Admit: 2013-05-19 | Discharge: 2013-05-19 | Disposition: A | Discharge status: Home / Self Care | Payer: BC Managed Care – PPO | Source: Ambulatory Visit | Attending: Obstetrics and Gynecology | Admitting: Obstetrics and Gynecology

## 2013-05-19 DIAGNOSIS — R928 Other abnormal and inconclusive findings on diagnostic imaging of breast: Secondary | ICD-10-CM

## 2013-06-29 ENCOUNTER — Encounter: Payer: Self-pay | Admitting: Gastroenterology

## 2013-10-10 ENCOUNTER — Ambulatory Visit
Admission: RE | Admit: 2013-10-10 | Discharge: 2013-10-10 | Disposition: A | Discharge status: Home / Self Care | Payer: BC Managed Care – PPO | Source: Ambulatory Visit | Attending: Unknown Physician Specialty | Admitting: Unknown Physician Specialty

## 2013-10-10 ENCOUNTER — Other Ambulatory Visit: Payer: Self-pay | Admitting: Unknown Physician Specialty

## 2013-10-10 DIAGNOSIS — S99919A Unspecified injury of unspecified ankle, initial encounter: Secondary | ICD-10-CM

## 2013-10-10 DIAGNOSIS — S99929A Unspecified injury of unspecified foot, initial encounter: Secondary | ICD-10-CM

## 2014-01-26 ENCOUNTER — Encounter: Payer: Self-pay | Admitting: Gastroenterology

## 2014-05-01 ENCOUNTER — Other Ambulatory Visit: Payer: Self-pay | Admitting: Obstetrics and Gynecology

## 2014-05-01 DIAGNOSIS — R921 Mammographic calcification found on diagnostic imaging of breast: Secondary | ICD-10-CM

## 2014-05-26 ENCOUNTER — Ambulatory Visit
Admission: RE | Admit: 2014-05-26 | Discharge: 2014-05-26 | Disposition: A | Discharge status: Home / Self Care | Payer: BC Managed Care – PPO | Source: Ambulatory Visit | Attending: Obstetrics and Gynecology | Admitting: Obstetrics and Gynecology

## 2014-05-26 DIAGNOSIS — R921 Mammographic calcification found on diagnostic imaging of breast: Secondary | ICD-10-CM

## 2016-02-07 DIAGNOSIS — R635 Abnormal weight gain: Secondary | ICD-10-CM | POA: Diagnosis not present

## 2016-02-07 DIAGNOSIS — N951 Menopausal and female climacteric states: Secondary | ICD-10-CM | POA: Diagnosis not present

## 2016-02-07 DIAGNOSIS — R140 Abdominal distension (gaseous): Secondary | ICD-10-CM | POA: Diagnosis not present

## 2016-05-21 DIAGNOSIS — B079 Viral wart, unspecified: Secondary | ICD-10-CM | POA: Diagnosis not present

## 2016-05-21 DIAGNOSIS — D485 Neoplasm of uncertain behavior of skin: Secondary | ICD-10-CM | POA: Diagnosis not present

## 2016-07-02 DIAGNOSIS — Z01411 Encounter for gynecological examination (general) (routine) with abnormal findings: Secondary | ICD-10-CM | POA: Diagnosis not present

## 2016-07-02 DIAGNOSIS — Z6828 Body mass index (BMI) 28.0-28.9, adult: Secondary | ICD-10-CM | POA: Diagnosis not present

## 2016-07-02 DIAGNOSIS — Z124 Encounter for screening for malignant neoplasm of cervix: Secondary | ICD-10-CM | POA: Diagnosis not present

## 2016-07-02 DIAGNOSIS — Z1231 Encounter for screening mammogram for malignant neoplasm of breast: Secondary | ICD-10-CM | POA: Diagnosis not present

## 2016-07-17 DIAGNOSIS — L821 Other seborrheic keratosis: Secondary | ICD-10-CM | POA: Diagnosis not present

## 2016-07-17 DIAGNOSIS — Z86018 Personal history of other benign neoplasm: Secondary | ICD-10-CM | POA: Diagnosis not present

## 2016-07-17 DIAGNOSIS — L719 Rosacea, unspecified: Secondary | ICD-10-CM | POA: Diagnosis not present

## 2016-07-17 DIAGNOSIS — D225 Melanocytic nevi of trunk: Secondary | ICD-10-CM | POA: Diagnosis not present

## 2016-08-11 DIAGNOSIS — R200 Anesthesia of skin: Secondary | ICD-10-CM | POA: Diagnosis not present

## 2016-08-11 DIAGNOSIS — M47812 Spondylosis without myelopathy or radiculopathy, cervical region: Secondary | ICD-10-CM | POA: Diagnosis not present

## 2016-08-11 DIAGNOSIS — R52 Pain, unspecified: Secondary | ICD-10-CM | POA: Diagnosis not present

## 2016-08-11 DIAGNOSIS — M79642 Pain in left hand: Secondary | ICD-10-CM | POA: Diagnosis not present

## 2016-08-19 DIAGNOSIS — R52 Pain, unspecified: Secondary | ICD-10-CM | POA: Diagnosis not present

## 2016-08-22 DIAGNOSIS — G5602 Carpal tunnel syndrome, left upper limb: Secondary | ICD-10-CM | POA: Diagnosis not present

## 2016-08-22 DIAGNOSIS — R52 Pain, unspecified: Secondary | ICD-10-CM | POA: Diagnosis not present

## 2016-08-22 DIAGNOSIS — R200 Anesthesia of skin: Secondary | ICD-10-CM | POA: Diagnosis not present

## 2016-09-25 DIAGNOSIS — M5030 Other cervical disc degeneration, unspecified cervical region: Secondary | ICD-10-CM | POA: Diagnosis not present

## 2016-09-25 DIAGNOSIS — M9901 Segmental and somatic dysfunction of cervical region: Secondary | ICD-10-CM | POA: Diagnosis not present

## 2016-09-25 DIAGNOSIS — M542 Cervicalgia: Secondary | ICD-10-CM | POA: Diagnosis not present

## 2016-09-25 DIAGNOSIS — M5413 Radiculopathy, cervicothoracic region: Secondary | ICD-10-CM | POA: Diagnosis not present

## 2016-09-26 ENCOUNTER — Encounter (INDEPENDENT_AMBULATORY_CARE_PROVIDER_SITE_OTHER): Payer: Self-pay | Admitting: Orthopaedic Surgery

## 2016-09-26 ENCOUNTER — Ambulatory Visit (INDEPENDENT_AMBULATORY_CARE_PROVIDER_SITE_OTHER): Payer: BLUE CROSS/BLUE SHIELD | Admitting: Orthopaedic Surgery

## 2016-09-26 DIAGNOSIS — M542 Cervicalgia: Secondary | ICD-10-CM | POA: Diagnosis not present
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
## 2016-10-02 DIAGNOSIS — M5030 Other cervical disc degeneration, unspecified cervical region: Secondary | ICD-10-CM | POA: Diagnosis not present

## 2016-10-02 DIAGNOSIS — M5413 Radiculopathy, cervicothoracic region: Secondary | ICD-10-CM | POA: Diagnosis not present

## 2016-10-02 DIAGNOSIS — M9901 Segmental and somatic dysfunction of cervical region: Secondary | ICD-10-CM | POA: Diagnosis not present

## 2016-10-02 DIAGNOSIS — M542 Cervicalgia: Secondary | ICD-10-CM | POA: Diagnosis not present

## 2016-10-14 DIAGNOSIS — M542 Cervicalgia: Secondary | ICD-10-CM | POA: Diagnosis not present

## 2016-10-14 DIAGNOSIS — M9901 Segmental and somatic dysfunction of cervical region: Secondary | ICD-10-CM | POA: Diagnosis not present

## 2016-10-14 DIAGNOSIS — M5413 Radiculopathy, cervicothoracic region: Secondary | ICD-10-CM | POA: Diagnosis not present

## 2016-10-14 DIAGNOSIS — M5030 Other cervical disc degeneration, unspecified cervical region: Secondary | ICD-10-CM | POA: Diagnosis not present

## 2016-10-15 DIAGNOSIS — M542 Cervicalgia: Secondary | ICD-10-CM | POA: Diagnosis not present

## 2016-10-21 DIAGNOSIS — M542 Cervicalgia: Secondary | ICD-10-CM | POA: Diagnosis not present

## 2016-10-23 DIAGNOSIS — M542 Cervicalgia: Secondary | ICD-10-CM | POA: Diagnosis not present

## 2016-10-28 DIAGNOSIS — M542 Cervicalgia: Secondary | ICD-10-CM | POA: Diagnosis not present

## 2016-10-30 DIAGNOSIS — M542 Cervicalgia: Secondary | ICD-10-CM | POA: Diagnosis not present

## 2016-11-03 DIAGNOSIS — M542 Cervicalgia: Secondary | ICD-10-CM | POA: Diagnosis not present

## 2016-11-04 DIAGNOSIS — M5413 Radiculopathy, cervicothoracic region: Secondary | ICD-10-CM | POA: Diagnosis not present

## 2016-11-04 DIAGNOSIS — M9901 Segmental and somatic dysfunction of cervical region: Secondary | ICD-10-CM | POA: Diagnosis not present

## 2016-11-04 DIAGNOSIS — M542 Cervicalgia: Secondary | ICD-10-CM | POA: Diagnosis not present

## 2016-11-04 DIAGNOSIS — M5030 Other cervical disc degeneration, unspecified cervical region: Secondary | ICD-10-CM | POA: Diagnosis not present

## 2016-11-05 DIAGNOSIS — M542 Cervicalgia: Secondary | ICD-10-CM | POA: Diagnosis not present

## 2016-12-02 DIAGNOSIS — M9903 Segmental and somatic dysfunction of lumbar region: Secondary | ICD-10-CM | POA: Diagnosis not present

## 2016-12-02 DIAGNOSIS — M9901 Segmental and somatic dysfunction of cervical region: Secondary | ICD-10-CM | POA: Diagnosis not present

## 2016-12-02 DIAGNOSIS — M542 Cervicalgia: Secondary | ICD-10-CM | POA: Diagnosis not present

## 2016-12-02 DIAGNOSIS — M47812 Spondylosis without myelopathy or radiculopathy, cervical region: Secondary | ICD-10-CM | POA: Diagnosis not present

## 2017-01-05 DIAGNOSIS — M47812 Spondylosis without myelopathy or radiculopathy, cervical region: Secondary | ICD-10-CM | POA: Diagnosis not present

## 2017-01-05 DIAGNOSIS — M9901 Segmental and somatic dysfunction of cervical region: Secondary | ICD-10-CM | POA: Diagnosis not present

## 2017-01-05 DIAGNOSIS — M542 Cervicalgia: Secondary | ICD-10-CM | POA: Diagnosis not present

## 2017-01-05 DIAGNOSIS — M9903 Segmental and somatic dysfunction of lumbar region: Secondary | ICD-10-CM | POA: Diagnosis not present

## 2017-01-28 DIAGNOSIS — M9901 Segmental and somatic dysfunction of cervical region: Secondary | ICD-10-CM | POA: Diagnosis not present

## 2017-01-28 DIAGNOSIS — M542 Cervicalgia: Secondary | ICD-10-CM | POA: Diagnosis not present

## 2017-01-28 DIAGNOSIS — M47812 Spondylosis without myelopathy or radiculopathy, cervical region: Secondary | ICD-10-CM | POA: Diagnosis not present

## 2017-01-28 DIAGNOSIS — M9903 Segmental and somatic dysfunction of lumbar region: Secondary | ICD-10-CM | POA: Diagnosis not present

## 2017-03-31 DIAGNOSIS — J0140 Acute pansinusitis, unspecified: Secondary | ICD-10-CM | POA: Diagnosis not present

## 2017-04-14 DIAGNOSIS — M62830 Muscle spasm of back: Secondary | ICD-10-CM | POA: Diagnosis not present

## 2017-04-14 DIAGNOSIS — M5136 Other intervertebral disc degeneration, lumbar region: Secondary | ICD-10-CM | POA: Diagnosis not present

## 2017-04-14 DIAGNOSIS — M545 Low back pain: Secondary | ICD-10-CM | POA: Diagnosis not present

## 2017-04-14 DIAGNOSIS — M9903 Segmental and somatic dysfunction of lumbar region: Secondary | ICD-10-CM | POA: Diagnosis not present

## 2017-05-21 DIAGNOSIS — M62830 Muscle spasm of back: Secondary | ICD-10-CM | POA: Diagnosis not present

## 2017-05-21 DIAGNOSIS — M9903 Segmental and somatic dysfunction of lumbar region: Secondary | ICD-10-CM | POA: Diagnosis not present

## 2017-05-21 DIAGNOSIS — M5136 Other intervertebral disc degeneration, lumbar region: Secondary | ICD-10-CM | POA: Diagnosis not present

## 2017-05-21 DIAGNOSIS — M545 Low back pain: Secondary | ICD-10-CM | POA: Diagnosis not present

## 2017-06-18 DIAGNOSIS — M9903 Segmental and somatic dysfunction of lumbar region: Secondary | ICD-10-CM | POA: Diagnosis not present

## 2017-06-18 DIAGNOSIS — M62830 Muscle spasm of back: Secondary | ICD-10-CM | POA: Diagnosis not present

## 2017-06-18 DIAGNOSIS — M5136 Other intervertebral disc degeneration, lumbar region: Secondary | ICD-10-CM | POA: Diagnosis not present

## 2017-06-18 DIAGNOSIS — M545 Low back pain: Secondary | ICD-10-CM | POA: Diagnosis not present

## 2017-07-02 DIAGNOSIS — Z86010 Personal history of colonic polyps: Secondary | ICD-10-CM | POA: Diagnosis not present

## 2017-07-02 DIAGNOSIS — Z1211 Encounter for screening for malignant neoplasm of colon: Secondary | ICD-10-CM | POA: Diagnosis not present

## 2017-07-02 DIAGNOSIS — Z85038 Personal history of other malignant neoplasm of large intestine: Secondary | ICD-10-CM | POA: Diagnosis not present

## 2017-07-08 DIAGNOSIS — Z6826 Body mass index (BMI) 26.0-26.9, adult: Secondary | ICD-10-CM | POA: Diagnosis not present

## 2017-07-08 DIAGNOSIS — Z01411 Encounter for gynecological examination (general) (routine) with abnormal findings: Secondary | ICD-10-CM | POA: Diagnosis not present

## 2017-07-08 DIAGNOSIS — Z124 Encounter for screening for malignant neoplasm of cervix: Secondary | ICD-10-CM | POA: Diagnosis not present

## 2017-07-08 DIAGNOSIS — Z1231 Encounter for screening mammogram for malignant neoplasm of breast: Secondary | ICD-10-CM | POA: Diagnosis not present

## 2017-07-08 LAB — HM PAP SMEAR: HM Pap smear: NORMAL

## 2017-07-22 DIAGNOSIS — D2272 Melanocytic nevi of left lower limb, including hip: Secondary | ICD-10-CM | POA: Diagnosis not present

## 2017-07-22 DIAGNOSIS — L719 Rosacea, unspecified: Secondary | ICD-10-CM | POA: Diagnosis not present

## 2017-07-22 DIAGNOSIS — Z86018 Personal history of other benign neoplasm: Secondary | ICD-10-CM | POA: Diagnosis not present

## 2017-07-22 DIAGNOSIS — D225 Melanocytic nevi of trunk: Secondary | ICD-10-CM | POA: Diagnosis not present

## 2017-07-30 DIAGNOSIS — M62830 Muscle spasm of back: Secondary | ICD-10-CM | POA: Diagnosis not present

## 2017-07-30 DIAGNOSIS — M9903 Segmental and somatic dysfunction of lumbar region: Secondary | ICD-10-CM | POA: Diagnosis not present

## 2017-07-30 DIAGNOSIS — M545 Low back pain: Secondary | ICD-10-CM | POA: Diagnosis not present

## 2017-07-30 DIAGNOSIS — M5136 Other intervertebral disc degeneration, lumbar region: Secondary | ICD-10-CM | POA: Diagnosis not present

## 2017-08-21 ENCOUNTER — Telehealth: Payer: Self-pay | Admitting: Internal Medicine

## 2017-08-21 NOTE — Telephone Encounter (Signed)
Copied from [place] [id number]. Topic: Appointment Scheduling - Scheduling Inquiry for Clinic >> Aug 21, 2017  4:01 PM Burnis Medin, NT wrote: Reason for CRM: Patient called and said she was referred by Rosine Door and Theodore Demark to establish with Dr. Quay Burow. Per one note Dr. Quay Burow isn't taking any new patient's. Pt said she was told that she would be taken as a new patient. If so please contact patient for scheduling.

## 2017-08-23 NOTE — Telephone Encounter (Signed)
Yes, I will accept her 

## 2017-08-25 NOTE — Telephone Encounter (Signed)
LVM for patient to call back to set up appointment

## 2017-09-09 NOTE — Progress Notes (Signed)
Subjective:    Patient ID: Miranda Guerra, female    DOB: [birth year], 57 y.o.   MRN: [id number]  HPI She is here to establish with a new pcp.    She has no concerns.    Medications and allergies reviewed with patient and updated if appropriate.  Patient Active Problem List   Diagnosis Date Noted  . Allergic rhinitis 09/10/2017  . Malignant neoplasm of sigmoid colon ([place]) 10/01/2012    Current Outpatient Medications on File Prior to Visit  Medication Sig Dispense Refill  . fluticasone (FLONASE) 50 MCG/ACT nasal spray Place 2 sprays into the nose daily.    Miranda Guerra ALLERGY & CONGESTION 60-120 MG 12 hr tablet Take 1 tablet by mouth 2 (two) times daily.  3   No current facility-administered medications on file prior to visit.     Past Medical History:  Diagnosis Date  . Allergic rhinitis   . Colon polyps   . Hyperlipidemia     Past Surgical History:  Procedure Laterality Date  . COLONOSCOPY    . CYSTECTOMY    . fallopian tube removal    . OOPHORECTOMY    . POLYPECTOMY      Social History   Socioeconomic History  . Marital status: Married    Spouse name: None  . Number of children: 0  . Years of education: None  . Highest education level: None  Social Needs  . Financial resource strain: None  . Food insecurity - worry: None  . Food insecurity - inability: None  . Transportation needs - medical: None  . Transportation needs - non-medical: None  Occupational History  . None  Tobacco Use  . Smoking status: Former Smoker    Last attempt to quit: 07/15/1987    Years since quitting: 30.1  . Smokeless tobacco: Never Used  Substance and Sexual Activity  . Alcohol use: Yes    Alcohol/week: 2.4 oz    Types: 4 Glasses of wine per week  . Drug use: No  . Sexual activity: No  Other Topics Concern  . None  Social History Narrative   Exercises 4/week - gym   Married , no children    Family History  Problem Relation Age of Onset  . Alzheimer's disease  Mother   . Hypertension Mother   . Transient ischemic attack Mother   . Stroke Mother   . Heart disease Maternal Grandfather   . Emphysema Maternal Grandfather   . Hyperlipidemia Maternal Grandfather   . Hypertension Maternal Grandfather   . Lung cancer Paternal Grandmother   . Emphysema Paternal Grandfather   . Heart disease Paternal Grandfather   . Colon polyps Father   . Hyperlipidemia Father   . Hearing loss Father   . Hypertension Maternal Grandmother   . Breast cancer Sister   . Breast cancer Maternal Aunt   . Breast cancer Sister   . Colon cancer Neg Hx     Review of Systems  Constitutional: Negative for chills, fatigue and fever.  Respiratory: Negative for cough, shortness of breath and wheezing.   Cardiovascular: Negative for chest pain, palpitations and leg swelling.  Gastrointestinal: Negative for abdominal pain, blood in stool, constipation, diarrhea and nausea.       No gerd  Musculoskeletal: Negative for arthralgias.  Neurological: Negative for light-headedness and headaches.  Psychiatric/Behavioral: Negative for dysphoric mood. The patient is not nervous/anxious.        Objective:   Vitals:   09/10/17 0750  BP:  102/70  Pulse: 70  Resp: 16  Temp: 98 F (36.7 C)  SpO2: 98%   Filed Weights   09/10/17 0750  Weight: 171 lb (77.6 kg)   Body mass index is 26.78 kg/m.  Wt Readings from Last 3 Encounters:  09/10/17 171 lb (77.6 kg)  12/28/12 170 lb (77.1 kg)  12/16/12 170 lb 12.8 oz (77.5 kg)     Physical Exam Constitutional: She appears well-developed and well-nourished. No distress.  HENT:  Head: Normocephalic and atraumatic.  Right Ear: External ear normal. Normal ear canal and TM Left Ear: External ear normal.  Normal ear canal and TM Mouth/Throat: Oropharynx is clear and moist.  Eyes: Conjunctivae and EOM are normal.  Neck: Neck supple. No tracheal deviation present. No thyromegaly present.  No carotid bruit  Cardiovascular: Normal rate,  regular rhythm and normal heart sounds.   No murmur heard.  No edema. Pulmonary/Chest: Effort normal and breath sounds normal. No respiratory distress. She has no wheezes. She has no rales.  Abdominal: Soft. She exhibits no distension. There is no tenderness.  Lymphadenopathy: She has no cervical adenopathy.  Skin: Skin is warm and dry. She is not diaphoretic.  Psychiatric: She has a normal mood and affect. Her behavior is normal.        Assessment & Plan:    See Problem List for Assessment and Plan of chronic medical problems.

## 2017-09-10 ENCOUNTER — Encounter: Payer: Self-pay | Admitting: Internal Medicine

## 2017-09-10 ENCOUNTER — Ambulatory Visit: Payer: BLUE CROSS/BLUE SHIELD | Admitting: Internal Medicine

## 2017-09-10 DIAGNOSIS — C187 Malignant neoplasm of sigmoid colon: Secondary | ICD-10-CM

## 2017-09-10 DIAGNOSIS — J309 Allergic rhinitis, unspecified: Secondary | ICD-10-CM | POA: Diagnosis not present

## 2017-09-10 NOTE — Assessment & Plan Note (Signed)
Uses Flonase daily Takes Allegra as needed Overall controlled

## 2017-09-10 NOTE — Patient Instructions (Signed)
   All other Health Maintenance issues reviewed.   All recommended immunizations and age-appropriate screenings are up-to-date or discussed.  No immunizations administered today.   Medications reviewed and updated.  No changes recommended at this time.   Please followup once a year

## 2017-09-10 NOTE — Assessment & Plan Note (Signed)
Colonoscopy up to date - due 04/2018

## 2017-09-15 ENCOUNTER — Telehealth: Payer: Self-pay | Admitting: Internal Medicine

## 2017-09-15 NOTE — Telephone Encounter (Signed)
Records request faxed to [facility] 09/15/17 LM

## 2017-09-18 ENCOUNTER — Encounter: Payer: Self-pay | Admitting: Internal Medicine

## 2017-09-18 DIAGNOSIS — E7800 Pure hypercholesterolemia, unspecified: Secondary | ICD-10-CM | POA: Insufficient documentation

## 2017-09-18 DIAGNOSIS — E785 Hyperlipidemia, unspecified: Secondary | ICD-10-CM | POA: Insufficient documentation

## 2017-09-18 NOTE — Telephone Encounter (Signed)
Records received from [facility] - forwarding via interoffice mail to [facility] on [place] 09/18/17 LM

## 2017-09-29 DIAGNOSIS — M5136 Other intervertebral disc degeneration, lumbar region: Secondary | ICD-10-CM | POA: Diagnosis not present

## 2017-09-29 DIAGNOSIS — M62830 Muscle spasm of back: Secondary | ICD-10-CM | POA: Diagnosis not present

## 2017-09-29 DIAGNOSIS — M545 Low back pain: Secondary | ICD-10-CM | POA: Diagnosis not present

## 2017-09-29 DIAGNOSIS — M9903 Segmental and somatic dysfunction of lumbar region: Secondary | ICD-10-CM | POA: Diagnosis not present

## 2017-10-07 ENCOUNTER — Encounter: Payer: Self-pay | Admitting: Internal Medicine

## 2017-10-19 NOTE — Progress Notes (Signed)
Subjective:    Patient ID: Miranda Guerra, female    DOB: 12-Oct-1960, 57 y.o.   MRN: [id number]  HPI She is here for an acute visit for cold symptoms.  Her symptoms started several days ago.  She is experiencing fever, chills, fatigue, nasal congestion, ear pain, sinus pressure, sore throat, cough, wheeze and body aches.  She denies sob, nausea and headaches.    She has taken tylenol, mucinex  Medications and allergies reviewed with patient and updated if appropriate.  Patient Active Problem List   Diagnosis Date Noted  . Hyperlipidemia 09/18/2017  . Allergic rhinitis 09/10/2017  . Malignant neoplasm of sigmoid colon ([place]) 10/01/2012    Current Outpatient Medications on File Prior to Visit  Medication Sig Dispense Refill  . ALLEGRA-D ALLERGY & CONGESTION 60-120 MG 12 hr tablet Take 1 tablet by mouth 2 (two) times daily.  3  . fluticasone (FLONASE) 50 MCG/ACT nasal spray Place 2 sprays into the nose daily.     No current facility-administered medications on file prior to visit.     Past Medical History:  Diagnosis Date  . Allergic rhinitis   . Colon polyps   . Hyperlipidemia     Past Surgical History:  Procedure Laterality Date  . COLONOSCOPY    . CYSTECTOMY    . fallopian tube removal    . OOPHORECTOMY    . POLYPECTOMY      Social History   Socioeconomic History  . Marital status: Married    Spouse name: Not on file  . Number of children: 0  . Years of education: Not on file  . Highest education level: Not on file  Occupational History  . Not on file  Social Needs  . Financial resource strain: Not on file  . Food insecurity:    Worry: Not on file    Inability: Not on file  . Transportation needs:    Medical: Not on file    Non-medical: Not on file  Tobacco Use  . Smoking status: Former Smoker    Last attempt to quit: 07/15/1987    Years since quitting: 30.2  . Smokeless tobacco: Never Used  Substance and Sexual Activity  . Alcohol use: Yes   Alcohol/week: 2.4 oz    Types: 4 Glasses of wine per week  . Drug use: No  . Sexual activity: Never  Lifestyle  . Physical activity:    Days per week: Not on file    Minutes per session: Not on file  . Stress: Not on file  Relationships  . Social connections:    Talks on phone: Not on file    Gets together: Not on file    Attends religious service: Not on file    Active member of club or organization: Not on file    Attends meetings of clubs or organizations: Not on file    Relationship status: Not on file  Other Topics Concern  . Not on file  Social History Narrative   Exercises 4/week - gym   Married , no children    Family History  Problem Relation Age of Onset  . Alzheimer's disease Mother   . Hypertension Mother   . Transient ischemic attack Mother   . Stroke Mother   . Heart disease Maternal Grandfather   . Emphysema Maternal Grandfather   . Hyperlipidemia Maternal Grandfather   . Hypertension Maternal Grandfather   . Lung cancer Paternal Grandmother   . Emphysema Paternal Grandfather   .  Heart disease Paternal Grandfather   . Colon polyps Father   . Hyperlipidemia Father   . Hearing loss Father   . Hypertension Maternal Grandmother   . Breast cancer Sister   . Breast cancer Maternal Aunt   . Breast cancer Sister   . Colon cancer Neg Hx     Review of Systems  Constitutional: Positive for chills, fatigue and fever.  HENT: Positive for congestion (yellow), ear pain, sinus pressure and sore throat.   Respiratory: Positive for cough and wheezing (mild). Negative for shortness of breath.   Gastrointestinal: Negative for nausea.  Musculoskeletal: Positive for myalgias.  Neurological: Negative for light-headedness (mild) and headaches.       Objective:   Vitals:   10/20/17 1434  BP: 122/68  Pulse: 74  Resp: 16  Temp: 98.7 F (37.1 C)  SpO2: 98%   Filed Weights   10/20/17 1434  Weight: 169 lb (76.7 kg)   Body mass index is 26.47 kg/m.  Wt  Readings from Last 3 Encounters:  10/20/17 169 lb (76.7 kg)  09/10/17 171 lb (77.6 kg)  12/28/12 170 lb (77.1 kg)     Physical Exam GENERAL APPEARANCE:mildly ill appearing, NAD EYES: conjunctiva clear, no icterus HEENT: bilateral tympanic membranes and ear canals normal, oropharynx with mild erythema, no thyromegaly, trachea midline, no cervical or supraclavicular lymphadenopathy LUNGS: Clear to auscultation without wheeze or crackles, unlabored breathing, good air entry bilaterally CARDIOVASCULAR: Normal S1,S2 without murmurs, no edema SKIN: warm, dry        Assessment & Plan:   See Problem List for Assessment and Plan of chronic medical problems.

## 2017-10-20 ENCOUNTER — Ambulatory Visit: Payer: BLUE CROSS/BLUE SHIELD | Admitting: Internal Medicine

## 2017-10-20 ENCOUNTER — Encounter: Payer: Self-pay | Admitting: Internal Medicine

## 2017-10-20 VITALS — BP 122/68 | HR 74 | Temp 98.7°F | Resp 16 | Wt 169.0 lb

## 2017-10-20 DIAGNOSIS — J0100 Acute maxillary sinusitis, unspecified: Secondary | ICD-10-CM

## 2017-10-20 MED ORDER — HYDROCODONE-HOMATROPINE 5-1.5 MG/5ML PO SYRP: 5.0000 mL | ORAL_SOLUTION | Freq: Three times a day (TID) | ORAL | 0 refills | Status: DC | PRN

## 2017-10-20 MED ORDER — AMOXICILLIN-POT CLAVULANATE 875-125 MG PO TABS: 1.0000 | ORAL_TABLET | Freq: Two times a day (BID) | ORAL | 0 refills | Status: DC

## 2017-10-20 NOTE — Patient Instructions (Signed)
Take the antibiotic as prescribed.  Use the cough medication at night.   Continue the over the counter cold medications.    Continue increase rest and fluids.   Call if no improvement     Sinusitis, Adult Sinusitis is soreness and inflammation of your sinuses. Sinuses are hollow spaces in the bones around your face. Your sinuses are located:  Around your eyes.  In the middle of your forehead.  Behind your nose.  In your cheekbones.  Your sinuses and nasal passages are lined with a stringy fluid (mucus). Mucus normally drains out of your sinuses. When your nasal tissues become inflamed or swollen, the mucus can become trapped or blocked so air cannot flow through your sinuses. This allows bacteria, viruses, and funguses to grow, which leads to infection. Sinusitis can develop quickly and last for 7?10 days (acute) or for more than 12 weeks (chronic). Sinusitis often develops after a cold. What are the causes? This condition is caused by anything that creates swelling in the sinuses or stops mucus from draining, including:  Allergies.  Asthma.  Bacterial or viral infection.  Abnormally shaped bones between the nasal passages.  Nasal growths that contain mucus (nasal polyps).  Narrow sinus openings.  Pollutants, such as chemicals or irritants in the air.  A foreign object stuck in the nose.  A fungal infection. This is rare.  What increases the risk? The following factors may make you more likely to develop this condition:  Having allergies or asthma.  Having had a recent cold or respiratory tract infection.  Having structural deformities or blockages in your nose or sinuses.  Having a weak immune system.  Doing a lot of swimming or diving.  Overusing nasal sprays.  Smoking.  What are the signs or symptoms? The main symptoms of this condition are pain and a feeling of pressure around the affected sinuses. Other symptoms include:  Upper  toothache.  Earache.  Headache.  Bad breath.  Decreased sense of smell and taste.  A cough that may get worse at night.  Fatigue.  Fever.  Thick drainage from your nose. The drainage is often green and it may contain pus (purulent).  Stuffy nose or congestion.  Postnasal drip. This is when extra mucus collects in the throat or back of the nose.  Swelling and warmth over the affected sinuses.  Sore throat.  Sensitivity to light.  How is this diagnosed? This condition is diagnosed based on symptoms, a medical history, and a physical exam. To find out if your condition is acute or chronic, your health care provider may:  Look in your nose for signs of nasal polyps.  Tap over the affected sinus to check for signs of infection.  View the inside of your sinuses using an imaging device that has a light attached (endoscope).  If your health care provider suspects that you have chronic sinusitis, you may also:  Be tested for allergies.  Have a sample of mucus taken from your nose (nasal culture) and checked for bacteria.  Have a mucus sample examined to see if your sinusitis is related to an allergy.  If your sinusitis does not respond to treatment and it lasts longer than 8 weeks, you may have an MRI or CT scan to check your sinuses. These scans also help to determine how severe your infection is. In rare cases, a bone biopsy may be done to rule out more serious types of fungal sinus disease. How is this treated? Treatment for sinusitis  depends on the cause and whether your condition is chronic or acute. If a virus is causing your sinusitis, your symptoms will go away on their own within 10 days. You may be given medicines to relieve your symptoms, including:  Topical nasal decongestants. They shrink swollen nasal passages and let mucus drain from your sinuses.  Antihistamines. These drugs block inflammation that is triggered by allergies. This can help to ease swelling in  your nose and sinuses.  Topical nasal corticosteroids. These are nasal sprays that ease inflammation and swelling in your nose and sinuses.  Nasal saline washes. These rinses can help to get rid of thick mucus in your nose.  If your condition is caused by bacteria, you will be given an antibiotic medicine. If your condition is caused by a fungus, you will be given an antifungal medicine. Surgery may be needed to correct underlying conditions, such as narrow nasal passages. Surgery may also be needed to remove polyps. Follow these instructions at home: Medicines  Take, use, or apply over-the-counter and prescription medicines only as told by your health care provider. These may include nasal sprays.  If you were prescribed an antibiotic medicine, take it as told by your health care provider. Do not stop taking the antibiotic even if you start to feel better. Hydrate and Humidify  Drink enough water to keep your urine clear or pale yellow. Staying hydrated will help to thin your mucus.  Use a cool mist humidifier to keep the humidity level in your home above 50%.  Inhale steam for 10-15 minutes, 3-4 times a day or as told by your health care provider. You can do this in the bathroom while a hot shower is running.  Limit your exposure to cool or dry air. Rest  Rest as much as possible.  Sleep with your head raised (elevated).  Make sure to get enough sleep each night. General instructions  Apply a warm, moist washcloth to your face 3-4 times a day or as told by your health care provider. This will help with discomfort.  Wash your hands often with soap and water to reduce your exposure to viruses and other germs. If soap and water are not available, use hand sanitizer.  Do not smoke. Avoid being around people who are smoking (secondhand smoke).  Keep all follow-up visits as told by your health care provider. This is important. Contact a health care provider if:  You have a  fever.  Your symptoms get worse.  Your symptoms do not improve within 10 days. Get help right away if:  You have a severe headache.  You have persistent vomiting.  You have pain or swelling around your face or eyes.  You have vision problems.  You develop confusion.  Your neck is stiff.  You have trouble breathing. This information is not intended to replace advice given to you by your health care provider. Make sure you discuss any questions you have with your health care provider. Document Released: 06/30/2005 Document Revised: 02/24/2016 Document Reviewed: 04/25/2015 Elsevier Interactive Patient Education  [employer].

## 2017-10-20 NOTE — Assessment & Plan Note (Signed)
Likely bacterial  Start augmentin Prescription cough syrup otc cold medications Rest, fluid Call if no improvement  

## 2017-10-30 ENCOUNTER — Telehealth: Payer: Self-pay | Admitting: Internal Medicine

## 2017-10-30 NOTE — Telephone Encounter (Signed)
Copied from [place] [id number]. Topic: Quick Communication - See Telephone Encounter >> Oct 30, 2017  9:43 AM Ether Griffins B wrote: CRM for notification. See Telephone encounter for: 10/30/17.  Pt would like to have her antibiotic extended. She is not feeling better. She is hoping someone can call her back Monday morning. CB# [id number]

## 2017-11-03 MED ORDER — AMOXICILLIN-POT CLAVULANATE 875-125 MG PO TABS: 1.0000 | ORAL_TABLET | Freq: Two times a day (BID) | ORAL | 0 refills | Status: DC

## 2017-11-03 NOTE — Telephone Encounter (Signed)
Spoke with pt, states she finished last dose of antibiotic 6 days ago. Still having productive cough. Advised pt I will send antibiotic to [facility] but to only pick up if she did not get any better.

## 2017-11-03 NOTE — Telephone Encounter (Signed)
Call her - I am just seeing this message now -- looks like she called Friday.  See what symptoms she is still having.  Ok to renew augmentin for 7 more days - pending if needed

## 2018-03-31 ENCOUNTER — Encounter: Payer: BLUE CROSS/BLUE SHIELD | Admitting: Internal Medicine

## 2018-04-01 NOTE — Patient Instructions (Addendum)
Tests ordered today. Your results will be released to MyChart (or called to you) after review, usually within 72hours after test completion. If any changes need to be made, you will be notified at that same time.  All other Health Maintenance issues reviewed.   All recommended immunizations and age-appropriate screenings are up-to-date or discussed.  No immunizations administered today.   Medications reviewed and updated.  Changes include :   none  Your prescription(s) have been submitted to your pharmacy. Please take as directed and contact our office if you believe you are having problem(s) with the medication(s).   Please followup in one year   Health Maintenance, Female Adopting a healthy lifestyle and getting preventive care can go a long way to promote health and wellness. Talk with your health care provider about what schedule of regular examinations is right for you. This is a good chance for you to check in with your provider about disease prevention and staying healthy. In between checkups, there are plenty of things you can do on your own. Experts have done a lot of research about which lifestyle changes and preventive measures are most likely to keep you healthy. Ask your health care provider for more information. Weight and diet Eat a healthy diet  Be sure to include plenty of vegetables, fruits, low-fat dairy products, and lean protein.  Do not eat a lot of foods high in solid fats, added sugars, or salt.  Get regular exercise. This is one of the most important things you can do for your health. ? Most adults should exercise for at least 150 minutes each week. The exercise should increase your heart rate and make you sweat (moderate-intensity exercise). ? Most adults should also do strengthening exercises at least twice a week. This is in addition to the moderate-intensity exercise.  Maintain a healthy weight  Body mass index (BMI) is a measurement that can be used to  identify possible weight problems. It estimates body fat based on height and weight. Your health care provider can help determine your BMI and help you achieve or maintain a healthy weight.  For females 20 years of age and older: ? A BMI below 18.5 is considered underweight. ? A BMI of 18.5 to 24.9 is normal. ? A BMI of 25 to 29.9 is considered overweight. ? A BMI of 30 and above is considered obese.  Watch levels of cholesterol and blood lipids  You should start having your blood tested for lipids and cholesterol at 58 years of age, then have this test every 5 years.  You may need to have your cholesterol levels checked more often if: ? Your lipid or cholesterol levels are high. ? You are older than 58 years of age. ? You are at high risk for heart disease.  Cancer screening Lung Cancer  Lung cancer screening is recommended for adults 55-80 years old who are at high risk for lung cancer because of a history of smoking.  A yearly low-dose CT scan of the lungs is recommended for people who: ? Currently smoke. ? Have quit within the past 15 years. ? Have at least a 30-pack-year history of smoking. A pack year is smoking an average of one pack of cigarettes a day for 1 year.  Yearly screening should continue until it has been 15 years since you quit.  Yearly screening should stop if you develop a health problem that would prevent you from having lung cancer treatment.  Breast Cancer  Practice breast self-awareness.   This means understanding how your breasts normally appear and feel.  It also means doing regular breast self-exams. Let your health care provider know about any changes, no matter how small.  If you are in your 20s or 30s, you should have a clinical breast exam (CBE) by a health care provider every 1-3 years as part of a regular health exam.  If you are 40 or older, have a CBE every year. Also consider having a breast X-ray (mammogram) every year.  If you have a  family history of breast cancer, talk to your health care provider about genetic screening.  If you are at high risk for breast cancer, talk to your health care provider about having an MRI and a mammogram every year.  Breast cancer gene (BRCA) assessment is recommended for women who have family members with BRCA-related cancers. BRCA-related cancers include: ? Breast. ? Ovarian. ? Tubal. ? Peritoneal cancers.  Results of the assessment will determine the need for genetic counseling and BRCA1 and BRCA2 testing.  Cervical Cancer Your health care provider may recommend that you be screened regularly for cancer of the pelvic organs (ovaries, uterus, and vagina). This screening involves a pelvic examination, including checking for microscopic changes to the surface of your cervix (Pap test). You may be encouraged to have this screening done every 3 years, beginning at age 21.  For women ages 30-65, health care providers may recommend pelvic exams and Pap testing every 3 years, or they may recommend the Pap and pelvic exam, combined with testing for human papilloma virus (HPV), every 5 years. Some types of HPV increase your risk of cervical cancer. Testing for HPV may also be done on women of any age with unclear Pap test results.  Other health care providers may not recommend any screening for nonpregnant women who are considered low risk for pelvic cancer and who do not have symptoms. Ask your health care provider if a screening pelvic exam is right for you.  If you have had past treatment for cervical cancer or a condition that could lead to cancer, you need Pap tests and screening for cancer for at least 20 years after your treatment. If Pap tests have been discontinued, your risk factors (such as having a new sexual partner) need to be reassessed to determine if screening should resume. Some women have medical problems that increase the chance of getting cervical cancer. In these cases, your  health care provider may recommend more frequent screening and Pap tests.  Colorectal Cancer  This type of cancer can be detected and often prevented.  Routine colorectal cancer screening usually begins at 58 years of age and continues through 58 years of age.  Your health care provider may recommend screening at an earlier age if you have risk factors for colon cancer.  Your health care provider may also recommend using home test kits to check for hidden blood in the stool.  A small camera at the end of a tube can be used to examine your colon directly (sigmoidoscopy or colonoscopy). This is done to check for the earliest forms of colorectal cancer.  Routine screening usually begins at age 50.  Direct examination of the colon should be repeated every 5-10 years through 58 years of age. However, you may need to be screened more often if early forms of precancerous polyps or small growths are found.  Skin Cancer  Check your skin from head to toe regularly.  Tell your health care provider about any   new moles or changes in moles, especially if there is a change in a mole's shape or color.  Also tell your health care provider if you have a mole that is larger than the size of a pencil eraser.  Always use sunscreen. Apply sunscreen liberally and repeatedly throughout the day.  Protect yourself by wearing long sleeves, pants, a wide-brimmed hat, and sunglasses whenever you are outside.  Heart disease, diabetes, and high blood pressure  High blood pressure causes heart disease and increases the risk of stroke. High blood pressure is more likely to develop in: ? People who have blood pressure in the high end of the normal range (130-139/85-89 mm Hg). ? People who are overweight or obese. ? People who are African American.  If you are 18-39 years of age, have your blood pressure checked every 3-5 years. If you are 40 years of age or older, have your blood pressure checked every year. You  should have your blood pressure measured twice-once when you are at a hospital or clinic, and once when you are not at a hospital or clinic. Record the average of the two measurements. To check your blood pressure when you are not at a hospital or clinic, you can use: ? An automated blood pressure machine at a pharmacy. ? A home blood pressure monitor.  If you are between 55 years and 79 years old, ask your health care provider if you should take aspirin to prevent strokes.  Have regular diabetes screenings. This involves taking a blood sample to check your fasting blood sugar level. ? If you are at a normal weight and have a low risk for diabetes, have this test once every three years after 58 years of age. ? If you are overweight and have a high risk for diabetes, consider being tested at a younger age or more often. Preventing infection Hepatitis B  If you have a higher risk for hepatitis B, you should be screened for this virus. You are considered at high risk for hepatitis B if: ? You were born in a country where hepatitis B is common. Ask your health care provider which countries are considered high risk. ? Your parents were born in a high-risk country, and you have not been immunized against hepatitis B (hepatitis B vaccine). ? You have HIV or AIDS. ? You use needles to inject street drugs. ? You live with someone who has hepatitis B. ? You have had sex with someone who has hepatitis B. ? You get hemodialysis treatment. ? You take certain medicines for conditions, including cancer, organ transplantation, and autoimmune conditions.  Hepatitis C  Blood testing is recommended for: ? Everyone born from 1945 through 1965. ? Anyone with known risk factors for hepatitis C.  Sexually transmitted infections (STIs)  You should be screened for sexually transmitted infections (STIs) including gonorrhea and chlamydia if: ? You are sexually active and are younger than 58 years of age. ? You  are older than 58 years of age and your health care provider tells you that you are at risk for this type of infection. ? Your sexual activity has changed since you were last screened and you are at an increased risk for chlamydia or gonorrhea. Ask your health care provider if you are at risk.  If you do not have HIV, but are at risk, it may be recommended that you take a prescription medicine daily to prevent HIV infection. This is called pre-exposure prophylaxis (PrEP). You are considered at   if: ? You are sexually active and do not regularly use condoms or know the HIV status of your partner(s). ? You take drugs by injection. ? You are sexually active with a partner who has HIV.  Talk with your health care provider about whether you are at high risk of being infected with HIV. If you choose to begin PrEP, you should first be tested for HIV. You should then be tested every 3 months for as long as you are taking PrEP. Pregnancy  If you are premenopausal and you may become pregnant, ask your health care provider about preconception counseling.  If you may become pregnant, take 400 to 800 micrograms (mcg) of folic acid every day.  If you want to prevent pregnancy, talk to your health care provider about birth control (contraception). Osteoporosis and menopause  Osteoporosis is a disease in which the bones lose minerals and strength with aging. This can result in serious bone fractures. Your risk for osteoporosis can be identified using a bone density scan.  If you are 19 years of age or older, or if you are at risk for osteoporosis and fractures, ask your health care provider if you should be screened.  Ask your health care provider whether you should take a calcium or vitamin D supplement to lower your risk for osteoporosis.  Menopause may have certain physical symptoms and risks.  Hormone replacement therapy may reduce some of these symptoms and risks. Talk to your health care  provider about whether hormone replacement therapy is right for you. Follow these instructions at home:  Schedule regular health, dental, and eye exams.  Stay current with your immunizations.  Do not use any tobacco products including cigarettes, chewing tobacco, or electronic cigarettes.  If you are pregnant, do not drink alcohol.  If you are breastfeeding, limit how much and how often you drink alcohol.  Limit alcohol intake to no more than 1 drink per day for nonpregnant women. One drink equals 12 ounces of beer, 5 ounces of wine, or 1 ounces of hard liquor.  Do not use street drugs.  Do not share needles.  Ask your health care provider for help if you need support or information about quitting drugs.  Tell your health care provider if you often feel depressed.  Tell your health care provider if you have ever been abused or do not feel safe at home. This information is not intended to replace advice given to you by your health care provider. Make sure you discuss any questions you have with your health care provider. Document Released: 01/13/2011 Document Revised: 12/06/2015 Document Reviewed: 04/03/2015 Elsevier Interactive Patient Education  [employer].

## 2018-04-01 NOTE — Progress Notes (Signed)
Subjective:    Patient ID: Miranda Guerra, female    DOB: 1960-09-02, 57 y.o.   MRN: [id number]  HPI She is here for a physical exam.   She has no concerns. She feels well overall.    She has gained some weight.  She is exercising regularly -goes to the gym 4 days a week.    Medications and allergies reviewed with patient and updated if appropriate.  Patient Active Problem List   Diagnosis Date Noted  . Acute non-recurrent maxillary sinusitis 10/20/2017  . Hyperlipidemia 09/18/2017  . Allergic rhinitis 09/10/2017  . Malignant neoplasm of sigmoid colon ([place]) 10/01/2012    No current outpatient medications on file prior to visit.   No current facility-administered medications on file prior to visit.     Past Medical History:  Diagnosis Date  . Allergic rhinitis   . Colon polyps   . Hyperlipidemia     Past Surgical History:  Procedure Laterality Date  . COLONOSCOPY    . CYSTECTOMY    . fallopian tube removal    . OOPHORECTOMY    . POLYPECTOMY      Social History   Socioeconomic History  . Marital status: Married    Spouse name: Not on file  . Number of children: 0  . Years of education: Not on file  . Highest education level: Not on file  Occupational History  . Not on file  Social Needs  . Financial resource strain: Not on file  . Food insecurity:    Worry: Not on file    Inability: Not on file  . Transportation needs:    Medical: Not on file    Non-medical: Not on file  Tobacco Use  . Smoking status: Former Smoker    Last attempt to quit: 07/15/1987    Years since quitting: 30.7  . Smokeless tobacco: Never Used  Substance and Sexual Activity  . Alcohol use: Yes    Alcohol/week: 4.0 standard drinks    Types: 4 Glasses of wine per week  . Drug use: No  . Sexual activity: Never  Lifestyle  . Physical activity:    Days per week: Not on file    Minutes per session: Not on file  . Stress: Not on file  Relationships  . Social connections:   Talks on phone: Not on file    Gets together: Not on file    Attends religious service: Not on file    Active member of club or organization: Not on file    Attends meetings of clubs or organizations: Not on file    Relationship status: Not on file  Other Topics Concern  . Not on file  Social History Narrative   Exercises 4/week - gym   Married , no children    Family History  Problem Relation Age of Onset  . Alzheimer's disease Mother   . Hypertension Mother   . Transient ischemic attack Mother   . Stroke Mother   . Heart disease Maternal Grandfather   . Emphysema Maternal Grandfather   . Hyperlipidemia Maternal Grandfather   . Hypertension Maternal Grandfather   . Lung cancer Paternal Grandmother   . Emphysema Paternal Grandfather   . Heart disease Paternal Grandfather   . Colon polyps Father   . Hyperlipidemia Father   . Hearing loss Father   . Hypertension Maternal Grandmother   . Breast cancer Sister   . Breast cancer Maternal Aunt   . Breast cancer Sister   .  Colon cancer Neg Hx     Review of Systems  Constitutional: Negative for chills and fever.  HENT: Negative for hearing loss.   Eyes: Negative for visual disturbance.  Respiratory: Negative for cough, shortness of breath and wheezing.   Cardiovascular: Negative for chest pain, palpitations and leg swelling.  Gastrointestinal: Negative for abdominal pain, blood in stool, constipation, diarrhea and nausea.       No gerd  Genitourinary: Negative for dysuria and hematuria.  Musculoskeletal: Negative for arthralgias and back pain.  Skin: Negative for color change and rash.  Neurological: Negative for dizziness, light-headedness and headaches.  Psychiatric/Behavioral: Negative for dysphoric mood. The patient is not nervous/anxious.        Objective:   Vitals:   04/02/18 0829  BP: 116/72  Pulse: 68  Resp: 16  Temp: 98.5 F (36.9 C)  SpO2: 98%   Filed Weights   04/02/18 0829  Weight: 174 lb (78.9 kg)     Body mass index is 27.25 kg/m.  Wt Readings from Last 3 Encounters:  04/02/18 174 lb (78.9 kg)  10/20/17 169 lb (76.7 kg)  09/10/17 171 lb (77.6 kg)     Physical Exam Constitutional: She appears well-developed and well-nourished. No distress.  HENT:  Head: Normocephalic and atraumatic.  Right Ear: External ear normal. Normal ear canal and TM Left Ear: External ear normal.  Normal ear canal and TM Mouth/Throat: Oropharynx is clear and moist.  Eyes: Conjunctivae and EOM are normal.  Neck: Neck supple. No tracheal deviation present. No thyromegaly present.  No carotid bruit  Cardiovascular: Normal rate, regular rhythm and normal heart sounds.   No murmur heard.  No edema. Pulmonary/Chest: Effort normal and breath sounds normal. No respiratory distress. She has no wheezes. She has no rales.  Breast: deferred to Gyn Abdominal: Soft. She exhibits no distension. There is no tenderness.  Lymphadenopathy: She has no cervical adenopathy.  Skin: Skin is warm and dry. She is not diaphoretic.  Psychiatric: She has a normal mood and affect. Her behavior is normal.        Assessment & Plan:   Physical exam: Screening blood work   ordered Immunizations  Flu vaccine today, discussed shingles Colonoscopy   Up to date but due next month - will schedule Mammogram   Up to date  Gyn   Up to date  Eye exams  Up to date  EKG     None on file Exercise  Gym in the mornings 4/week Weight weight just above normal Skin    No concerns Substance abuse  none  See Problem List for Assessment and Plan of chronic medical problems.   fu 1 year

## 2018-04-02 ENCOUNTER — Encounter: Payer: Self-pay | Admitting: Internal Medicine

## 2018-04-02 ENCOUNTER — Other Ambulatory Visit (INDEPENDENT_AMBULATORY_CARE_PROVIDER_SITE_OTHER): Payer: BLUE CROSS/BLUE SHIELD

## 2018-04-02 ENCOUNTER — Ambulatory Visit (INDEPENDENT_AMBULATORY_CARE_PROVIDER_SITE_OTHER): Payer: BLUE CROSS/BLUE SHIELD | Admitting: Internal Medicine

## 2018-04-02 VITALS — BP 116/72 | HR 68 | Temp 98.5°F | Resp 16 | Ht 67.0 in | Wt 174.0 lb

## 2018-04-02 DIAGNOSIS — E782 Mixed hyperlipidemia: Secondary | ICD-10-CM | POA: Diagnosis not present

## 2018-04-02 DIAGNOSIS — Z0000 Encounter for general adult medical examination without abnormal findings: Secondary | ICD-10-CM

## 2018-04-02 LAB — LIPID PANEL
Cholesterol: 265 mg/dL — ABNORMAL HIGH (ref 0–200)
HDL: 67.6 mg/dL (ref >39.00)
LDL Cholesterol: 168 mg/dL — ABNORMAL HIGH (ref 0–99)
NONHDL: 197.15
Total CHOL/HDL Ratio: 4
Triglycerides: 146 mg/dL (ref 0.0–149.0)
VLDL: 29.2 mg/dL (ref 0.0–40.0)

## 2018-04-02 LAB — CBC WITH DIFFERENTIAL/PLATELET
BASOS ABS: 0.1 10*3/uL (ref 0.0–0.1)
Basophils Relative: 1.1 % (ref 0.0–3.0)
Eosinophils Absolute: 0.2 10*3/uL (ref 0.0–0.7)
Eosinophils Relative: 2.9 % (ref 0.0–5.0)
HCT: 42 % (ref 36.0–46.0)
HEMOGLOBIN: 14.3 g/dL (ref 12.0–15.0)
LYMPHS ABS: 2.2 10*3/uL (ref 0.7–4.0)
Lymphocytes Relative: 40.2 % (ref 12.0–46.0)
MCHC: 33.9 g/dL (ref 30.0–36.0)
MCV: 89.1 fl (ref 78.0–100.0)
MONO ABS: 0.6 10*3/uL (ref 0.1–1.0)
Monocytes Relative: 10.1 % (ref 3.0–12.0)
NEUTROS PCT: 45.7 % (ref 43.0–77.0)
Neutro Abs: 2.5 10*3/uL (ref 1.4–7.7)
Platelets: 257 10*3/uL (ref 150.0–400.0)
RBC: 4.72 Mil/uL (ref 3.87–5.11)
RDW: 12.8 % (ref 11.5–15.5)
WBC: 5.5 10*3/uL (ref 4.0–10.5)

## 2018-04-02 LAB — COMPREHENSIVE METABOLIC PANEL
ALK PHOS: 64 U/L (ref 39–117)
ALT: 33 U/L (ref 0–35)
AST: 32 U/L (ref 0–37)
Albumin: 4.3 g/dL (ref 3.5–5.2)
BILIRUBIN TOTAL: 0.7 mg/dL (ref 0.2–1.2)
BUN: 14 mg/dL (ref 6–23)
CO2: 26 mEq/L (ref 19–32)
Calcium: 9.3 mg/dL (ref 8.4–10.5)
Chloride: 106 mEq/L (ref 96–112)
Creatinine, Ser: 0.76 mg/dL (ref 0.40–1.20)
GFR: 83.06 mL/min (ref >60.00)
GLUCOSE: 87 mg/dL (ref 70–99)
POTASSIUM: 4.7 meq/L (ref 3.5–5.1)
SODIUM: 141 meq/L (ref 135–145)
TOTAL PROTEIN: 7 g/dL (ref 6.0–8.3)

## 2018-04-02 LAB — TSH: TSH: 2.88 u[IU]/mL (ref 0.35–4.50)

## 2018-04-02 MED ORDER — FLUTICASONE PROPIONATE 50 MCG/ACT NA SUSP: 2.0000 | Freq: Every day | NASAL | 11 refills | Status: DC

## 2018-04-02 MED ORDER — ALLEGRA-D ALLERGY & CONGESTION 60-120 MG PO TB12: 1.0000 | ORAL_TABLET | Freq: Two times a day (BID) | ORAL | 3 refills | Status: DC

## 2018-04-02 NOTE — Assessment & Plan Note (Signed)
H/o high cholesterol Exercises, eats fairly healthy - likely genetic Never been on medication Check lipid panel  Regular exercise and healthy diet encouraged

## 2018-05-24 DIAGNOSIS — Z1211 Encounter for screening for malignant neoplasm of colon: Secondary | ICD-10-CM | POA: Diagnosis not present

## 2018-05-24 DIAGNOSIS — D122 Benign neoplasm of ascending colon: Secondary | ICD-10-CM | POA: Diagnosis not present

## 2018-05-24 DIAGNOSIS — Z85038 Personal history of other malignant neoplasm of large intestine: Secondary | ICD-10-CM | POA: Diagnosis not present

## 2018-05-24 DIAGNOSIS — K635 Polyp of colon: Secondary | ICD-10-CM | POA: Diagnosis not present

## 2018-05-24 DIAGNOSIS — D127 Benign neoplasm of rectosigmoid junction: Secondary | ICD-10-CM | POA: Diagnosis not present

## 2018-05-24 DIAGNOSIS — Z86010 Personal history of colonic polyps: Secondary | ICD-10-CM | POA: Diagnosis not present

## 2018-06-17 NOTE — Progress Notes (Signed)
Subjective:    Patient ID: Miranda Guerra, female    DOB: 1961-07-01, 57 y.o.   MRN: [id number]  HPI The patient is here for an acute visit.   Neck pain:  It started four weeks. She stepped in a hole and jerked and wonders if that was what caused it.  She has had knee pain since then, but this past week it has gotten worse.  She initially was not taking anything, but started taking Aleve this week.  She is taking 1 in the morning and 1 at night and that has been helping.  If she makes a turning motion to the left she has a very sharp pain.  The pain seems to be focused on the left side.  She denies any radiation of the pain into the arms.  She denies any numbness or tingling.  She denies any headaches, lightheadedness or dizziness.  Her neck is stiff.  She did go for massage, but it did not help.  She has tried heat and ice and that has helped.  She has never had anything like this in the past.   Medications and allergies reviewed with patient and updated if appropriate.  Patient Active Problem List   Diagnosis Date Noted  . Acute non-recurrent maxillary sinusitis 10/20/2017  . Hyperlipidemia 09/18/2017  . Allergic rhinitis 09/10/2017  . Malignant neoplasm of sigmoid colon ([facility] [facility]) 10/01/2012    Current Outpatient Medications on File Prior to Visit  Medication Sig Dispense Refill  . ALLEGRA-D ALLERGY & CONGESTION 60-120 MG 12 hr tablet Take 1 tablet by mouth 2 (two) times daily. 180 tablet 3  . fluticasone (FLONASE) 50 MCG/ACT nasal spray Place 2 sprays into both nostrils daily. 16 g 11   No current facility-administered medications on file prior to visit.     Past Medical History:  Diagnosis Date  . Allergic rhinitis   . Colon polyps   . Hyperlipidemia     Past Surgical History:  Procedure Laterality Date  . COLONOSCOPY    . CYSTECTOMY    . fallopian tube removal    . OOPHORECTOMY    . POLYPECTOMY      Social History   Socioeconomic History  . Marital status:  Married    Spouse name: Not on file  . Number of children: 0  . Years of education: Not on file  . Highest education level: Not on file  Occupational History  . Not on file  Social Needs  . Financial resource strain: Not on file  . Food insecurity:    Worry: Not on file    Inability: Not on file  . Transportation needs:    Medical: Not on file    Non-medical: Not on file  Tobacco Use  . Smoking status: Former Smoker    Last attempt to quit: 07/15/1987    Years since quitting: 30.9  . Smokeless tobacco: Never Used  Substance and Sexual Activity  . Alcohol use: Yes    Alcohol/week: 4.0 standard drinks    Types: 4 Glasses of wine per week  . Drug use: No  . Sexual activity: Never  Lifestyle  . Physical activity:    Days per week: Not on file    Minutes per session: Not on file  . Stress: Not on file  Relationships  . Social connections:    Talks on phone: Not on file    Gets together: Not on file    Attends religious service: Not on file  Active member of club or organization: Not on file    Attends meetings of clubs or organizations: Not on file    Relationship status: Not on file  Other Topics Concern  . Not on file  Social History Narrative   Exercises 4/week - gym   Married , no children    Family History  Problem Relation Age of Onset  . Alzheimer's disease Mother   . Hypertension Mother   . Transient ischemic attack Mother   . Stroke Mother   . Heart disease Maternal Grandfather   . Emphysema Maternal Grandfather   . Hyperlipidemia Maternal Grandfather   . Hypertension Maternal Grandfather   . Lung cancer Paternal Grandmother   . Emphysema Paternal Grandfather   . Heart disease Paternal Grandfather   . Colon polyps Father   . Hyperlipidemia Father   . Hearing loss Father   . Hypertension Maternal Grandmother   . Breast cancer Sister   . Breast cancer Maternal Aunt   . Breast cancer Sister   . Colon cancer Neg Hx     Review of Systems    Constitutional: Negative for chills and fever.  Musculoskeletal: Positive for neck pain and neck stiffness. Negative for back pain.  Neurological: Negative for dizziness, weakness, light-headedness and headaches.       Objective:   Vitals:   06/18/18 1123  BP: 110/74  Pulse: 65  Resp: 16  Temp: 98 F (36.7 C)  SpO2: 98%   BP Readings from Last 3 Encounters:  06/18/18 110/74  04/02/18 116/72  10/20/17 122/68   Wt Readings from Last 3 Encounters:  06/18/18 176 lb (79.8 kg)  04/02/18 174 lb (78.9 kg)  10/20/17 169 lb (76.7 kg)   Body mass index is 27.57 kg/m.   Physical Exam  Constitutional: She appears well-developed and well-nourished. No distress.  HENT:  Head: Normocephalic and atraumatic.  Musculoskeletal:  Pain with palpation left upper neck near base of skull, no cervical spine tenderness or deformity, no trapezius tenderness in upper back or mid back.  Increased pain with turning head to left  Neurological: No sensory deficit.  Normal strength and sensation bilateral upper extremities  Skin: Skin is warm and dry. She is not diaphoretic. No erythema.           Assessment & Plan:    See Problem List for Assessment and Plan of chronic medical problems.

## 2018-06-18 ENCOUNTER — Encounter: Payer: Self-pay | Admitting: Internal Medicine

## 2018-06-18 ENCOUNTER — Ambulatory Visit (INDEPENDENT_AMBULATORY_CARE_PROVIDER_SITE_OTHER): Payer: BLUE CROSS/BLUE SHIELD | Admitting: Internal Medicine

## 2018-06-18 VITALS — BP 110/74 | HR 65 | Temp 98.0°F | Resp 16 | Ht 67.0 in | Wt 176.0 lb

## 2018-06-18 DIAGNOSIS — M542 Cervicalgia: Secondary | ICD-10-CM

## 2018-06-18 DIAGNOSIS — G8929 Other chronic pain: Secondary | ICD-10-CM | POA: Insufficient documentation

## 2018-06-18 MED ORDER — METHOCARBAMOL 500 MG PO TABS: 500.0000 mg | ORAL_TABLET | Freq: Four times a day (QID) | ORAL | 0 refills | Status: DC

## 2018-06-18 NOTE — Assessment & Plan Note (Signed)
Muscular pain left side of neck No radiculopathy Increase Aleve to 2 pills twice daily-take with food Heat, ice Trial of methocarbamol up to 4 times a day as needed Can do gentle stretching, massage Call if no improvement

## 2018-06-18 NOTE — Patient Instructions (Addendum)
Start Aleve 2 tab twice daily with food.    Take a muscle relaxer up to three times a day.   Use ice/heat for your neck.

## 2018-07-20 DIAGNOSIS — M9901 Segmental and somatic dysfunction of cervical region: Secondary | ICD-10-CM | POA: Diagnosis not present

## 2018-07-20 DIAGNOSIS — M791 Myalgia, unspecified site: Secondary | ICD-10-CM | POA: Diagnosis not present

## 2018-07-20 DIAGNOSIS — M9902 Segmental and somatic dysfunction of thoracic region: Secondary | ICD-10-CM | POA: Diagnosis not present

## 2018-07-23 DIAGNOSIS — M9902 Segmental and somatic dysfunction of thoracic region: Secondary | ICD-10-CM | POA: Diagnosis not present

## 2018-07-23 DIAGNOSIS — M9901 Segmental and somatic dysfunction of cervical region: Secondary | ICD-10-CM | POA: Diagnosis not present

## 2018-07-23 DIAGNOSIS — M791 Myalgia, unspecified site: Secondary | ICD-10-CM | POA: Diagnosis not present

## 2018-07-27 DIAGNOSIS — M791 Myalgia, unspecified site: Secondary | ICD-10-CM | POA: Diagnosis not present

## 2018-07-27 DIAGNOSIS — M9902 Segmental and somatic dysfunction of thoracic region: Secondary | ICD-10-CM | POA: Diagnosis not present

## 2018-07-27 DIAGNOSIS — M9901 Segmental and somatic dysfunction of cervical region: Secondary | ICD-10-CM | POA: Diagnosis not present

## 2018-07-28 DIAGNOSIS — M438X2 Other specified deforming dorsopathies, cervical region: Secondary | ICD-10-CM | POA: Diagnosis not present

## 2018-07-28 DIAGNOSIS — M50322 Other cervical disc degeneration at C5-C6 level: Secondary | ICD-10-CM | POA: Diagnosis not present

## 2018-07-29 DIAGNOSIS — Z01411 Encounter for gynecological examination (general) (routine) with abnormal findings: Secondary | ICD-10-CM | POA: Diagnosis not present

## 2018-07-29 DIAGNOSIS — Z1231 Encounter for screening mammogram for malignant neoplasm of breast: Secondary | ICD-10-CM | POA: Diagnosis not present

## 2018-07-29 DIAGNOSIS — Z6826 Body mass index (BMI) 26.0-26.9, adult: Secondary | ICD-10-CM | POA: Diagnosis not present

## 2018-07-29 DIAGNOSIS — Z124 Encounter for screening for malignant neoplasm of cervix: Secondary | ICD-10-CM | POA: Diagnosis not present

## 2018-07-30 DIAGNOSIS — M9901 Segmental and somatic dysfunction of cervical region: Secondary | ICD-10-CM | POA: Diagnosis not present

## 2018-07-30 DIAGNOSIS — M9902 Segmental and somatic dysfunction of thoracic region: Secondary | ICD-10-CM | POA: Diagnosis not present

## 2018-07-30 DIAGNOSIS — M791 Myalgia, unspecified site: Secondary | ICD-10-CM | POA: Diagnosis not present

## 2018-08-04 DIAGNOSIS — L719 Rosacea, unspecified: Secondary | ICD-10-CM | POA: Diagnosis not present

## 2018-08-04 DIAGNOSIS — Z86018 Personal history of other benign neoplasm: Secondary | ICD-10-CM | POA: Diagnosis not present

## 2018-08-04 DIAGNOSIS — D225 Melanocytic nevi of trunk: Secondary | ICD-10-CM | POA: Diagnosis not present

## 2018-08-04 DIAGNOSIS — Z23 Encounter for immunization: Secondary | ICD-10-CM | POA: Diagnosis not present

## 2018-08-04 DIAGNOSIS — L821 Other seborrheic keratosis: Secondary | ICD-10-CM | POA: Diagnosis not present

## 2018-12-01 ENCOUNTER — Encounter: Payer: Self-pay | Admitting: Internal Medicine

## 2018-12-01 ENCOUNTER — Ambulatory Visit (INDEPENDENT_AMBULATORY_CARE_PROVIDER_SITE_OTHER): Payer: BLUE CROSS/BLUE SHIELD | Admitting: Internal Medicine

## 2018-12-01 DIAGNOSIS — J0100 Acute maxillary sinusitis, unspecified: Secondary | ICD-10-CM | POA: Diagnosis not present

## 2018-12-01 MED ORDER — AZITHROMYCIN 250 MG PO TABS
ORAL_TABLET | ORAL | 0 refills | Status: DC
Start: 2018-12-01 — End: 2019-03-29

## 2018-12-01 NOTE — Assessment & Plan Note (Signed)
Likely bacterial  Start zpak otc cold medications Rest, fluid Call if no improvement  

## 2018-12-01 NOTE — Progress Notes (Signed)
Virtual Visit via Video Note  I connected with Miranda Guerra on 12/01/18 at  1:00 PM EDT by a video enabled telemedicine application and verified that I am speaking with the correct person using two identifiers.   I discussed the limitations of evaluation and management by telemedicine and the availability of in person appointments. The patient expressed understanding and agreed to proceed.  The patient is currently at home and I am in the office.    No referring provider.    History of Present Illness: This is an acute visit for sinus issues.  She is working from home.  She thinks she has a sinus infection.  It has been going on for a while. She has nasal congestion with green mucus, sinus pressure, intermittent sore throat, hoarseness, mild cough for a couple of days and headaches at times.  She did have some dizziness last week.    She started taking mucinex.  She is taking flonase and an oral anti-histamine.     Review of Systems  Constitutional: Negative for fever.  HENT: Positive for congestion (green mucus) and sore throat (intermittent). Negative for ear pain and sinus pain (sinus pressure).        Hoarseness  Respiratory: Positive for cough (mild for 1-2 days). Negative for shortness of breath and wheezing.   Neurological: Positive for dizziness and headaches.      Social History   Socioeconomic History  . Marital status: Married    Spouse name: Not on file  . Number of children: 0  . Years of education: Not on file  . Highest education level: Not on file  Occupational History  . Not on file  Social Needs  . Financial resource strain: Not on file  . Food insecurity:    Worry: Not on file    Inability: Not on file  . Transportation needs:    Medical: Not on file    Non-medical: Not on file  Tobacco Use  . Smoking status: Former Smoker    Last attempt to quit: 07/15/1987    Years since quitting: 31.4  . Smokeless tobacco: Never Used  Substance and Sexual  Activity  . Alcohol use: Yes    Alcohol/week: 4.0 standard drinks    Types: 4 Glasses of wine per week  . Drug use: No  . Sexual activity: Never  Lifestyle  . Physical activity:    Days per week: Not on file    Minutes per session: Not on file  . Stress: Not on file  Relationships  . Social connections:    Talks on phone: Not on file    Gets together: Not on file    Attends religious service: Not on file    Active member of club or organization: Not on file    Attends meetings of clubs or organizations: Not on file    Relationship status: Not on file  Other Topics Concern  . Not on file  Social History Narrative   Exercises 4/week - gym   Married , no children     Observations/Objective: Appears well in NAD Hoarse sounding with nasal congestion Breathing normally   Assessment and Plan:  See Problem List for Assessment and Plan of chronic medical problems.   Follow Up Instructions:    I discussed the assessment and treatment plan with the patient. The patient was provided an opportunity to ask questions and all were answered. The patient agreed with the plan and demonstrated an understanding of the instructions.  The patient was advised to call back or seek an in-person evaluation if the symptoms worsen or if the condition fails to improve as anticipated.    Binnie Rail, MD

## 2018-12-02 ENCOUNTER — Telehealth: Payer: Self-pay | Admitting: *Deleted

## 2018-12-02 NOTE — Telephone Encounter (Signed)
See 12/01/18 OV note Dx: J01.00 Acute non-recurrent maxillary sinusitis provided to pharmacy for Zpak rx.

## 2019-02-03 ENCOUNTER — Telehealth: Payer: Self-pay | Admitting: Emergency Medicine

## 2019-02-03 NOTE — Telephone Encounter (Signed)
error 

## 2019-03-25 ENCOUNTER — Telehealth: Payer: Self-pay

## 2019-03-25 NOTE — Telephone Encounter (Signed)
Copied from [place] [id number]. Topic: Appointment Scheduling - Scheduling Inquiry for Clinic >> Mar 25, 2019  4:08 PM [name] wrote: Patient would like to Miranda Guerra her physical  scheduled for 9/21 due to work conflict, patient requesting 9/15 thru 9/17, patient would like a call back today, please advise

## 2019-03-25 NOTE — Telephone Encounter (Signed)
Yes whenever she can get in

## 2019-03-25 NOTE — Telephone Encounter (Signed)
Scheduled

## 2019-03-29 NOTE — Patient Instructions (Addendum)
Tests ordered today. Your results will be released to MyChart (or called to you) after review.  If any changes need to be made, you will be notified at that same time.  All other Health Maintenance issues reviewed.   All recommended immunizations and age-appropriate screenings are up-to-date or discussed.  No immunization administered today.   Medications reviewed and updated.  Changes include :   none   Please followup in 1 year   Health Maintenance, Female Adopting a healthy lifestyle and getting preventive care are important in promoting health and wellness. Ask your health care provider about:  The right schedule for you to have regular tests and exams.  Things you can do on your own to prevent diseases and keep yourself healthy. What should I know about diet, weight, and exercise? Eat a healthy diet   Eat a diet that includes plenty of vegetables, fruits, low-fat dairy products, and lean protein.  Do not eat a lot of foods that are high in solid fats, added sugars, or sodium. Maintain a healthy weight Body mass index (BMI) is used to identify weight problems. It estimates body fat based on height and weight. Your health care provider can help determine your BMI and help you achieve or maintain a healthy weight. Get regular exercise Get regular exercise. This is one of the most important things you can do for your health. Most adults should:  Exercise for at least 150 minutes each week. The exercise should increase your heart rate and make you sweat (moderate-intensity exercise).  Do strengthening exercises at least twice a week. This is in addition to the moderate-intensity exercise.  Spend less time sitting. Even light physical activity can be beneficial. Watch cholesterol and blood lipids Have your blood tested for lipids and cholesterol at 59 years of age, then have this test every 5 years. Have your cholesterol levels checked more often if:  Your lipid or cholesterol  levels are high.  You are older than 59 years of age.  You are at high risk for heart disease. What should I know about cancer screening? Depending on your health history and family history, you may need to have cancer screening at various ages. This may include screening for:  Breast cancer.  Cervical cancer.  Colorectal cancer.  Skin cancer.  Lung cancer. What should I know about heart disease, diabetes, and high blood pressure? Blood pressure and heart disease  High blood pressure causes heart disease and increases the risk of stroke. This is more likely to develop in people who have high blood pressure readings, are of African descent, or are overweight.  Have your blood pressure checked: ? Every 3-5 years if you are 18-39 years of age. ? Every year if you are 40 years old or older. Diabetes Have regular diabetes screenings. This checks your fasting blood sugar level. Have the screening done:  Once every three years after age 40 if you are at a normal weight and have a low risk for diabetes.  More often and at a younger age if you are overweight or have a high risk for diabetes. What should I know about preventing infection? Hepatitis B If you have a higher risk for hepatitis B, you should be screened for this virus. Talk with your health care provider to find out if you are at risk for hepatitis B infection. Hepatitis C Testing is recommended for:  Everyone born from 1945 through 1965.  Anyone with known risk factors for hepatitis C. Sexually transmitted   Sexually transmitted infections (STIs)  Get screened for STIs, including gonorrhea and chlamydia, if: ? You are sexually active and are younger than 59 years of age. ? You are older than 59 years of age and your health care provider tells you that you are at risk for this type of infection. ? Your sexual activity has changed since you were last screened, and you are at increased risk for chlamydia or gonorrhea. Ask your  health care provider if you are at risk.  Ask your health care provider about whether you are at high risk for HIV. Your health care provider may recommend a prescription medicine to help prevent HIV infection. If you choose to take medicine to prevent HIV, you should first get tested for HIV. You should then be tested every 3 months for as long as you are taking the medicine. Pregnancy  If you are about to stop having your period (premenopausal) and you may become pregnant, seek counseling before you get pregnant.  Take 400 to 800 micrograms (mcg) of folic acid every day if you become pregnant.  Ask for birth control (contraception) if you want to prevent pregnancy. Osteoporosis and menopause Osteoporosis is a disease in which the bones lose minerals and strength with aging. This can result in bone fractures. If you are 65 years old or older, or if you are at risk for osteoporosis and fractures, ask your health care provider if you should:  Be screened for bone loss.  Take a calcium or vitamin D supplement to lower your risk of fractures.  Be given hormone replacement therapy (HRT) to treat symptoms of menopause. Follow these instructions at home: Lifestyle  Do not use any products that contain nicotine or tobacco, such as cigarettes, e-cigarettes, and chewing tobacco. If you need help quitting, ask your health care provider.  Do not use street drugs.  Do not share needles.  Ask your health care provider for help if you need support or information about quitting drugs. Alcohol use  Do not drink alcohol if: ? Your health care provider tells you not to drink. ? You are pregnant, may be pregnant, or are planning to become pregnant.  If you drink alcohol: ? Limit how much you use to 0-1 drink a day. ? Limit intake if you are breastfeeding.  Be aware of how much alcohol is in your drink. In the [place] one drink equals one 12 oz bottle of beer (355 mL), one 5 oz glass of wine (148  mL), or one 1 oz glass of hard liquor (44 mL). General instructions  Schedule regular health, dental, and eye exams.  Stay current with your vaccines.  Tell your health care provider if: ? You often feel depressed. ? You have ever been abused or do not feel safe at home. Summary  Adopting a healthy lifestyle and getting preventive care are important in promoting health and wellness.  Follow your health care provider's instructions about healthy diet, exercising, and getting tested or screened for diseases.  Follow your health care provider's instructions on monitoring your cholesterol and blood pressure. This information is not intended to replace advice given to you by your health care provider. Make sure you discuss any questions you have with your health care provider. Document Released: 01/13/2011 Document Revised: 06/23/2018 Document Reviewed: 06/23/2018 Elsevier Patient Education  2020 [employer]  

## 2019-03-29 NOTE — Progress Notes (Signed)
Subjective:    Patient ID: Miranda Guerra, female    DOB: 03/22/1961, 58 y.o.   MRN: [id number]  HPI She is here for a physical exam.   She is still having neck issues.  She has started seeing a Restaurant manager, fast food.  She has had multiple visits with a chiropractor and it has helped, but she is hoping that she will not need to continue to see her long-term.  She has decreased ROM of her neck.  She does exercises at home.  She did have x-rays done by a chiropractor and has some arthritis.  She also has loss of her cervical curvature.  She has no other concerns or questions and overall feels well.  Medications and allergies reviewed with patient and updated if appropriate.  Patient Active Problem List   Diagnosis Date Noted  . Neck pain, musculoskeletal 06/18/2018  . Hyperlipidemia 09/18/2017  . Allergic rhinitis 09/10/2017  . Malignant neoplasm of sigmoid colon ([place]) 10/01/2012    Current Outpatient Medications on File Prior to Visit  Medication Sig Dispense Refill  . ALLEGRA-D ALLERGY & CONGESTION 60-120 MG 12 hr tablet Take 1 tablet by mouth 2 (two) times daily. 180 tablet 3  . fluticasone (FLONASE) 50 MCG/ACT nasal spray Place 2 sprays into both nostrils daily. 16 g 11   No current facility-administered medications on file prior to visit.     Past Medical History:  Diagnosis Date  . Allergic rhinitis   . Colon polyps   . Hyperlipidemia     Past Surgical History:  Procedure Laterality Date  . COLONOSCOPY    . CYSTECTOMY    . fallopian tube removal    . OOPHORECTOMY    . POLYPECTOMY      Social History   Socioeconomic History  . Marital status: Married    Spouse name: Not on file  . Number of children: 0  . Years of education: Not on file  . Highest education level: Not on file  Occupational History  . Not on file  Social Needs  . Financial resource strain: Not on file  . Food insecurity    Worry: Not on file    Inability: Not on file  . Transportation needs     Medical: Not on file    Non-medical: Not on file  Tobacco Use  . Smoking status: Former Smoker    Quit date: 07/15/1987    Years since quitting: 31.7  . Smokeless tobacco: Never Used  Substance and Sexual Activity  . Alcohol use: Yes    Alcohol/week: 4.0 standard drinks    Types: 4 Glasses of wine per week  . Drug use: No  . Sexual activity: Never  Lifestyle  . Physical activity    Days per week: Not on file    Minutes per session: Not on file  . Stress: Not on file  Relationships  . Social Herbalist on phone: Not on file    Gets together: Not on file    Attends religious service: Not on file    Active member of club or organization: Not on file    Attends meetings of clubs or organizations: Not on file    Relationship status: Not on file  Other Topics Concern  . Not on file  Social History Narrative   Exercises 4/week - gym   Married , no children    Family History  Problem Relation Age of Onset  . Alzheimer's disease Mother   .  Hypertension Mother   . Transient ischemic attack Mother   . Stroke Mother   . Heart disease Maternal Grandfather   . Emphysema Maternal Grandfather   . Hyperlipidemia Maternal Grandfather   . Hypertension Maternal Grandfather   . Lung cancer Paternal Grandmother   . Emphysema Paternal Grandfather   . Heart disease Paternal Grandfather   . Colon polyps Father   . Hyperlipidemia Father   . Hearing loss Father   . Hypertension Maternal Grandmother   . Breast cancer Sister   . Breast cancer Maternal Aunt   . Breast cancer Sister   . Colon cancer Neg Hx     Review of Systems  Constitutional: Negative for chills, fatigue and fever.  Eyes: Negative for visual disturbance.  Respiratory: Negative for cough, shortness of breath and wheezing.   Cardiovascular: Negative for chest pain, palpitations and leg swelling.  Gastrointestinal: Negative for abdominal pain, blood in stool, constipation, diarrhea and nausea.       No gerd   Genitourinary: Negative for dysuria and hematuria.  Musculoskeletal: Positive for back pain and neck pain.  Skin: Negative for color change and rash.  Neurological: Negative for light-headedness and headaches.  Psychiatric/Behavioral: Negative for dysphoric mood. The patient is not nervous/anxious.        Objective:   Vitals:   03/30/19 1028  BP: 114/80  Pulse: 71  Resp: 16  Temp: 98.1 F (36.7 C)  SpO2: 97%   Filed Weights   03/30/19 1028  Weight: 178 lb (80.7 kg)   Body mass index is 27.88 kg/m.  BP Readings from Last 3 Encounters:  03/30/19 114/80  06/18/18 110/74  04/02/18 116/72    Wt Readings from Last 3 Encounters:  03/30/19 178 lb (80.7 kg)  06/18/18 176 lb (79.8 kg)  04/02/18 174 lb (78.9 kg)     Physical Exam Constitutional: She appears well-developed and well-nourished. No distress.  HENT:  Head: Normocephalic and atraumatic.  Right Ear: External ear normal. Normal ear canal and TM Left Ear: External ear normal.  Normal ear canal and TM Mouth/Throat: Oropharynx is clear and moist.  Eyes: Conjunctivae and EOM are normal.  Neck: Neck supple. No tracheal deviation present. No thyromegaly present.  No carotid bruit  Cardiovascular: Normal rate, regular rhythm and normal heart sounds.   No murmur heard.  No edema. Pulmonary/Chest: Effort normal and breath sounds normal. No respiratory distress. She has no wheezes. She has no rales.  Breast: deferred   Abdominal: Soft. She exhibits no distension. There is no tenderness.  Lymphadenopathy: She has no cervical adenopathy.  Skin: Skin is warm and dry. She is not diaphoretic.  Psychiatric: She has a normal mood and affect. Her behavior is normal.        Assessment & Plan:   Physical exam: Screening blood work ordered Immunizations  Flu vaccine deferred - will get next month, deferred shingrix Colonoscopy   Done 05/2018 Mammogram  Up to date  Gyn  Up to date  Eye exams  Up to date  Exercise   Regular - walking Weight   overweight-we will work on losing 10 pounds Skin no concerns Substance abuse  none  See Problem List for Assessment and Plan of chronic medical problems.    Follow-up in 1 year

## 2019-03-30 ENCOUNTER — Other Ambulatory Visit: Payer: Self-pay

## 2019-03-30 ENCOUNTER — Encounter: Payer: Self-pay | Admitting: Internal Medicine

## 2019-03-30 ENCOUNTER — Ambulatory Visit (INDEPENDENT_AMBULATORY_CARE_PROVIDER_SITE_OTHER): Payer: BC Managed Care – PPO | Admitting: Internal Medicine

## 2019-03-30 ENCOUNTER — Other Ambulatory Visit (INDEPENDENT_AMBULATORY_CARE_PROVIDER_SITE_OTHER): Payer: BC Managed Care – PPO

## 2019-03-30 VITALS — BP 114/80 | HR 71 | Temp 98.1°F | Resp 16 | Ht 67.0 in | Wt 178.0 lb

## 2019-03-30 DIAGNOSIS — C187 Malignant neoplasm of sigmoid colon: Secondary | ICD-10-CM | POA: Diagnosis not present

## 2019-03-30 DIAGNOSIS — J309 Allergic rhinitis, unspecified: Secondary | ICD-10-CM

## 2019-03-30 DIAGNOSIS — G8929 Other chronic pain: Secondary | ICD-10-CM

## 2019-03-30 DIAGNOSIS — E782 Mixed hyperlipidemia: Secondary | ICD-10-CM

## 2019-03-30 DIAGNOSIS — Z0000 Encounter for general adult medical examination without abnormal findings: Secondary | ICD-10-CM | POA: Diagnosis not present

## 2019-03-30 DIAGNOSIS — M542 Cervicalgia: Secondary | ICD-10-CM

## 2019-03-30 LAB — CBC WITH DIFFERENTIAL/PLATELET
Basophils Absolute: 0.1 10*3/uL (ref 0.0–0.1)
Basophils Relative: 0.9 % (ref 0.0–3.0)
Eosinophils Absolute: 0.1 10*3/uL (ref 0.0–0.7)
Eosinophils Relative: 1.6 % (ref 0.0–5.0)
HCT: 43 % (ref 36.0–46.0)
Hemoglobin: 14.3 g/dL (ref 12.0–15.0)
Lymphocytes Relative: 43.8 % (ref 12.0–46.0)
Lymphs Abs: 3.1 10*3/uL (ref 0.7–4.0)
MCHC: 33.3 g/dL (ref 30.0–36.0)
MCV: 90.6 fl (ref 78.0–100.0)
Monocytes Absolute: 0.6 10*3/uL (ref 0.1–1.0)
Monocytes Relative: 8.4 % (ref 3.0–12.0)
Neutro Abs: 3.2 10*3/uL (ref 1.4–7.7)
Neutrophils Relative %: 45.3 % (ref 43.0–77.0)
Platelets: 261 10*3/uL (ref 150.0–400.0)
RBC: 4.75 Mil/uL (ref 3.87–5.11)
RDW: 12.9 % (ref 11.5–15.5)
WBC: 7.1 10*3/uL (ref 4.0–10.5)

## 2019-03-30 LAB — LIPID PANEL
Cholesterol: 302 mg/dL — ABNORMAL HIGH (ref 0–200)
HDL: 67.6 mg/dL (ref >39.00)
LDL Cholesterol: 195 mg/dL — ABNORMAL HIGH (ref 0–99)
NonHDL: 233.91
Total CHOL/HDL Ratio: 4
Triglycerides: 195 mg/dL — ABNORMAL HIGH (ref 0.0–149.0)
VLDL: 39 mg/dL (ref 0.0–40.0)

## 2019-03-30 LAB — COMPREHENSIVE METABOLIC PANEL
ALT: 30 U/L (ref 0–35)
AST: 25 U/L (ref 0–37)
Albumin: 4.6 g/dL (ref 3.5–5.2)
Alkaline Phosphatase: 67 U/L (ref 39–117)
BUN: 14 mg/dL (ref 6–23)
CO2: 27 mEq/L (ref 19–32)
Calcium: 9.9 mg/dL (ref 8.4–10.5)
Chloride: 104 mEq/L (ref 96–112)
Creatinine, Ser: 0.81 mg/dL (ref 0.40–1.20)
GFR: 72.36 mL/min (ref >60.00)
Glucose, Bld: 90 mg/dL (ref 70–99)
Potassium: 4.8 mEq/L (ref 3.5–5.1)
Sodium: 140 mEq/L (ref 135–145)
Total Bilirubin: 0.8 mg/dL (ref 0.2–1.2)
Total Protein: 7.3 g/dL (ref 6.0–8.3)

## 2019-03-30 LAB — TSH: TSH: 2.94 u[IU]/mL (ref 0.35–4.50)

## 2019-03-30 NOTE — Assessment & Plan Note (Signed)
Did have an x-ray with her chiropractor-there is some arthritis and loss of the cervical curvature Has seen a chiropractor several times and there has been improvement She will continue exercises at home, chiropractor as needed Discussed heat/ice/topical medications

## 2019-03-30 NOTE — Assessment & Plan Note (Signed)
Controlled Continue Allegra-D, Flonase

## 2019-03-30 NOTE — Assessment & Plan Note (Signed)
Colonoscopy up-to-date-last had it November 2019 No concerning symptoms

## 2019-03-30 NOTE — Assessment & Plan Note (Signed)
Elevated LDL She is exercising regularly Discussed healthy diet, continue regular exercise She will work on losing 10 pounds Check lipid panel, TSH, CMP

## 2019-03-31 ENCOUNTER — Encounter: Payer: Self-pay | Admitting: Internal Medicine

## 2019-04-04 ENCOUNTER — Encounter: Payer: BLUE CROSS/BLUE SHIELD | Admitting: Internal Medicine

## 2019-04-30 ENCOUNTER — Other Ambulatory Visit: Payer: Self-pay | Admitting: Internal Medicine

## 2019-11-29 NOTE — Progress Notes (Signed)
Subjective:    Patient ID: Miranda Guerra, female    DOB: 04/05/1961, 59 y.o.   MRN: [id number]  HPI The patient is here for an acute visit for anxiety.   She has a stressful job.  Her mother-in-law has recurrent ovarian cancer which is causing a lot of stress.  She was diagnosed last June and had chemo and surgery.  She initially did well but was diagnosed with recurrent cancer.     She would like something for anxiety as needed.    She is walking regularly.     Medications and allergies reviewed with patient and updated if appropriate.  Patient Active Problem List   Diagnosis Date Noted  . Chronic neck pain 06/18/2018  . Hyperlipidemia 09/18/2017  . Allergic rhinitis 09/10/2017  . Malignant neoplasm of sigmoid colon ([place]) 10/01/2012    Current Outpatient Medications on File Prior to Visit  Medication Sig Dispense Refill  . ALLEGRA-D ALLERGY & CONGESTION 60-120 MG 12 hr tablet Take 1 tablet by mouth 2 (two) times daily. 180 tablet 3  . fluticasone (FLONASE) 50 MCG/ACT nasal spray SPRAY 2 SPRAYS INTO EACH NOSTRIL EVERY DAY 48 mL 3   No current facility-administered medications on file prior to visit.    Past Medical History:  Diagnosis Date  . Allergic rhinitis   . Colon polyps   . Hyperlipidemia     Past Surgical History:  Procedure Laterality Date  . COLONOSCOPY    . CYSTECTOMY    . fallopian tube removal    . OOPHORECTOMY    . POLYPECTOMY      Social History   Socioeconomic History  . Marital status: Married    Spouse name: Not on file  . Number of children: 0  . Years of education: Not on file  . Highest education level: Not on file  Occupational History  . Not on file  Tobacco Use  . Smoking status: Former Smoker    Quit date: 07/15/1987    Years since quitting: 32.4  . Smokeless tobacco: Never Used  Substance and Sexual Activity  . Alcohol use: Yes    Alcohol/week: 4.0 standard drinks    Types: 4 Glasses of wine per week  . Drug use: No    . Sexual activity: Never  Other Topics Concern  . Not on file  Social History Narrative   Exercises 4/week - gym   Married , no children   Social Determinants of Health   Financial Resource Strain:   . Difficulty of Paying Living Expenses:   Food Insecurity:   . Worried About Charity fundraiser in the Last Year:   . Arboriculturist in the Last Year:   Transportation Needs:   . Film/video editor (Medical):   Marland Kitchen Lack of Transportation (Non-Medical):   Physical Activity:   . Days of Exercise per Week:   . Minutes of Exercise per Session:   Stress:   . Feeling of Stress :   Social Connections:   . Frequency of Communication with Friends and Family:   . Frequency of Social Gatherings with Friends and Family:   . Attends Religious Services:   . Active Member of Clubs or Organizations:   . Attends Archivist Meetings:   Marland Kitchen Marital Status:     Family History  Problem Relation Age of Onset  . Alzheimer's disease Mother   . Hypertension Mother   . Transient ischemic attack Mother   . Stroke Mother   .  Heart disease Maternal Grandfather   . Emphysema Maternal Grandfather   . Hyperlipidemia Maternal Grandfather   . Hypertension Maternal Grandfather   . Lung cancer Paternal Grandmother   . Emphysema Paternal Grandfather   . Heart disease Paternal Grandfather   . Colon polyps Father   . Hyperlipidemia Father   . Hearing loss Father   . Hypertension Maternal Grandmother   . Breast cancer Sister   . Breast cancer Maternal Aunt   . Breast cancer Sister   . Colon cancer Neg Hx     Review of Systems  Constitutional: Negative for appetite change.  Respiratory: Negative for shortness of breath.   Cardiovascular: Positive for palpitations. Negative for chest pain.  Psychiatric/Behavioral: Negative for sleep disturbance.       Objective:   Vitals:   11/30/19 1304  BP: 114/70  Pulse: 80  Resp: 16  Temp: 98.3 F (36.8 C)  SpO2: 96%   BP Readings from  Last 3 Encounters:  11/30/19 114/70  03/30/19 114/80  06/18/18 110/74   Wt Readings from Last 3 Encounters:  11/30/19 176 lb (79.8 kg)  03/30/19 178 lb (80.7 kg)  06/18/18 176 lb (79.8 kg)   Body mass index is 27.57 kg/m.   Physical Exam Constitutional:      General: She is not in acute distress.    Appearance: Normal appearance. She is not ill-appearing.  HENT:     Head: Normocephalic and atraumatic.  Skin:    General: Skin is warm and dry.  Neurological:     Mental Status: She is alert.  Psychiatric:        Behavior: Behavior normal.        Thought Content: Thought content normal.        Judgment: Judgment normal.     Comments: Crying intermittently            Assessment & Plan:    See Problem List for Assessment and Plan of chronic medical problems.    This visit occurred during the SARS-CoV-2 public health emergency.  Safety protocols were in place, including screening questions prior to the visit, additional usage of staff PPE, and extensive cleaning of exam room while observing appropriate contact time as indicated for disinfecting solutions.

## 2019-11-30 ENCOUNTER — Other Ambulatory Visit: Payer: Self-pay

## 2019-11-30 ENCOUNTER — Encounter: Payer: Self-pay | Admitting: Internal Medicine

## 2019-11-30 ENCOUNTER — Ambulatory Visit (INDEPENDENT_AMBULATORY_CARE_PROVIDER_SITE_OTHER): Payer: 59 | Admitting: Internal Medicine

## 2019-11-30 DIAGNOSIS — F419 Anxiety disorder, unspecified: Secondary | ICD-10-CM | POA: Insufficient documentation

## 2019-11-30 MED ORDER — ALPRAZOLAM 0.25 MG PO TABS
0.2500 mg | ORAL_TABLET | Freq: Two times a day (BID) | ORAL | 0 refills | Status: DC | PRN
Start: 2019-11-30 — End: 2021-05-29

## 2019-11-30 NOTE — Patient Instructions (Signed)
  Medications reviewed and updated.  Changes include :   Xanax as needed for anxiety.   Your prescription(s) have been submitted to your pharmacy. Please take as directed and contact our office if you believe you are having problem(s) with the medication(s).   Let me know if this does not help or if we need to try something different.

## 2019-11-30 NOTE — Assessment & Plan Note (Signed)
Acute increased stress and anxiety Related to her mother-in-law having recurrent cancer Discussed options Will start xanax twice daily as needed May need to consider an SSRI in the future, but she would like to avoid daily medication

## 2019-12-08 ENCOUNTER — Other Ambulatory Visit: Payer: Self-pay

## 2019-12-08 MED ORDER — ALLEGRA-D ALLERGY & CONGESTION 60-120 MG PO TB12: 1.0000 | ORAL_TABLET | Freq: Two times a day (BID) | ORAL | 1 refills | Status: DC

## 2019-12-13 ENCOUNTER — Telehealth: Payer: Self-pay | Admitting: Internal Medicine

## 2019-12-13 ENCOUNTER — Encounter: Payer: Self-pay | Admitting: Internal Medicine

## 2019-12-13 NOTE — Telephone Encounter (Signed)
Error

## 2020-05-09 ENCOUNTER — Telehealth: Payer: Self-pay | Admitting: Internal Medicine

## 2020-05-09 DIAGNOSIS — E782 Mixed hyperlipidemia: Secondary | ICD-10-CM

## 2020-05-09 DIAGNOSIS — Z0000 Encounter for general adult medical examination without abnormal findings: Secondary | ICD-10-CM

## 2020-05-09 NOTE — Telephone Encounter (Signed)
Patient wondering if she can get her lab work done before her physical on 06/04/20

## 2020-05-09 NOTE — Telephone Encounter (Signed)
Ordered for GV - please schedule

## 2020-05-10 NOTE — Telephone Encounter (Signed)
Message left for patient with lab hours and location.

## 2020-05-30 ENCOUNTER — Other Ambulatory Visit (INDEPENDENT_AMBULATORY_CARE_PROVIDER_SITE_OTHER): Payer: 59

## 2020-05-30 ENCOUNTER — Other Ambulatory Visit: Payer: 59

## 2020-05-30 ENCOUNTER — Other Ambulatory Visit: Payer: Self-pay

## 2020-05-30 DIAGNOSIS — E782 Mixed hyperlipidemia: Secondary | ICD-10-CM

## 2020-05-30 DIAGNOSIS — Z0000 Encounter for general adult medical examination without abnormal findings: Secondary | ICD-10-CM | POA: Diagnosis not present

## 2020-05-30 LAB — CBC WITH DIFFERENTIAL/PLATELET
Basophils Absolute: 0.1 10*3/uL (ref 0.0–0.1)
Basophils Relative: 0.9 % (ref 0.0–3.0)
Eosinophils Absolute: 0.2 10*3/uL (ref 0.0–0.7)
Eosinophils Relative: 2.9 % (ref 0.0–5.0)
HCT: 42.4 % (ref 36.0–46.0)
Hemoglobin: 14.3 g/dL (ref 12.0–15.0)
Lymphocytes Relative: 40.2 % (ref 12.0–46.0)
Lymphs Abs: 2.6 10*3/uL (ref 0.7–4.0)
MCHC: 33.7 g/dL (ref 30.0–36.0)
MCV: 89.5 fl (ref 78.0–100.0)
Monocytes Absolute: 0.5 10*3/uL (ref 0.1–1.0)
Monocytes Relative: 8.3 % (ref 3.0–12.0)
Neutro Abs: 3.1 10*3/uL (ref 1.4–7.7)
Neutrophils Relative %: 47.7 % (ref 43.0–77.0)
Platelets: 280 10*3/uL (ref 150.0–400.0)
RBC: 4.74 Mil/uL (ref 3.87–5.11)
RDW: 12.8 % (ref 11.5–15.5)
WBC: 6.4 10*3/uL (ref 4.0–10.5)

## 2020-05-30 LAB — COMPREHENSIVE METABOLIC PANEL
ALT: 19 U/L (ref 0–35)
AST: 17 U/L (ref 0–37)
Albumin: 4.4 g/dL (ref 3.5–5.2)
Alkaline Phosphatase: 60 U/L (ref 39–117)
BUN: 19 mg/dL (ref 6–23)
CO2: 28 mEq/L (ref 19–32)
Calcium: 9.4 mg/dL (ref 8.4–10.5)
Chloride: 104 mEq/L (ref 96–112)
Creatinine, Ser: 0.81 mg/dL (ref 0.40–1.20)
GFR: 79.01 mL/min (ref >60.00)
Glucose, Bld: 96 mg/dL (ref 70–99)
Potassium: 4.4 mEq/L (ref 3.5–5.1)
Sodium: 140 mEq/L (ref 135–145)
Total Bilirubin: 0.7 mg/dL (ref 0.2–1.2)
Total Protein: 7.3 g/dL (ref 6.0–8.3)

## 2020-05-30 LAB — TSH: TSH: 2.85 u[IU]/mL (ref 0.35–4.50)

## 2020-05-30 LAB — LIPID PANEL
Cholesterol: 271 mg/dL — ABNORMAL HIGH (ref 0–200)
HDL: 63.4 mg/dL (ref >39.00)
LDL Cholesterol: 181 mg/dL — ABNORMAL HIGH (ref 0–99)
NonHDL: 207.87
Total CHOL/HDL Ratio: 4
Triglycerides: 134 mg/dL (ref 0.0–149.0)
VLDL: 26.8 mg/dL (ref 0.0–40.0)

## 2020-06-03 NOTE — Progress Notes (Signed)
Subjective:    Patient ID: Miranda Guerra, female    DOB: Apr 11, 1961, 59 y.o.   MRN: [id number]   This visit occurred during the SARS-CoV-2 public health emergency.  Safety protocols were in place, including screening questions prior to the visit, additional usage of staff PPE, and extensive cleaning of exam room while observing appropriate contact time as indicated for disinfecting solutions.    HPI She is here for a physical exam.   Overall she is doing well.  Her stepmom/best friend is still dealing with recurrence of her ovarian cancer.  She feels she is dealing with this better.  She did end up taking 1 alprazolam, but has not needed any since then.  She has no major concerns.  Medications and allergies reviewed with patient and updated if appropriate.  Patient Active Problem List   Diagnosis Date Noted  . Anxiety 11/30/2019  . Chronic neck pain 06/18/2018  . Hyperlipidemia 09/18/2017  . Allergic rhinitis 09/10/2017  . Malignant neoplasm of sigmoid colon ([place]) 10/01/2012    Current Outpatient Medications on File Prior to Visit  Medication Sig Dispense Refill  . ALLEGRA-D ALLERGY & CONGESTION 60-120 MG 12 hr tablet Take 1 tablet by mouth 2 (two) times daily. 180 tablet 1  . ALPRAZolam (XANAX) 0.25 MG tablet Take 1 tablet (0.25 mg total) by mouth 2 (two) times daily as needed for anxiety. 30 tablet 0  . Cholecalciferol (VITAMIN D) 50 MCG (2000 UT) CAPS Take by mouth.    . fluticasone (FLONASE) 50 MCG/ACT nasal spray SPRAY 2 SPRAYS INTO EACH NOSTRIL EVERY DAY 48 mL 3  . Multiple Vitamin (MULTIVITAMIN ADULT PO) Take by mouth.     No current facility-administered medications on file prior to visit.    Past Medical History:  Diagnosis Date  . Allergic rhinitis   . Colon polyps   . Hyperlipidemia     Past Surgical History:  Procedure Laterality Date  . COLONOSCOPY    . CYSTECTOMY    . fallopian tube removal    . OOPHORECTOMY    . POLYPECTOMY      Social  History   Socioeconomic History  . Marital status: Married    Spouse name: Not on file  . Number of children: 0  . Years of education: Not on file  . Highest education level: Not on file  Occupational History  . Not on file  Tobacco Use  . Smoking status: Former Smoker    Quit date: 07/15/1987    Years since quitting: 32.9  . Smokeless tobacco: Never Used  Substance and Sexual Activity  . Alcohol use: Yes    Alcohol/week: 4.0 standard drinks    Types: 4 Glasses of wine per week  . Drug use: No  . Sexual activity: Never  Other Topics Concern  . Not on file  Social History Narrative   Exercises 4/week - gym   Married , no children   Social Determinants of Health   Financial Resource Strain:   . Difficulty of Paying Living Expenses: Not on file  Food Insecurity:   . Worried About Charity fundraiser in the Last Year: Not on file  . Ran Out of Food in the Last Year: Not on file  Transportation Needs:   . Lack of Transportation (Medical): Not on file  . Lack of Transportation (Non-Medical): Not on file  Physical Activity:   . Days of Exercise per Week: Not on file  . Minutes of Exercise per Session:  Not on file  Stress:   . Feeling of Stress : Not on file  Social Connections:   . Frequency of Communication with Friends and Family: Not on file  . Frequency of Social Gatherings with Friends and Family: Not on file  . Attends Religious Services: Not on file  . Active Member of Clubs or Organizations: Not on file  . Attends Archivist Meetings: Not on file  . Marital Status: Not on file    Family History  Problem Relation Age of Onset  . Alzheimer's disease Mother   . Hypertension Mother   . Transient ischemic attack Mother   . Stroke Mother   . Heart disease Maternal Grandfather   . Emphysema Maternal Grandfather   . Hyperlipidemia Maternal Grandfather   . Hypertension Maternal Grandfather   . Lung cancer Paternal Grandmother   . Emphysema Paternal  Grandfather   . Heart disease Paternal Grandfather   . Colon polyps Father   . Hyperlipidemia Father   . Hearing loss Father   . Hypertension Maternal Grandmother   . Breast cancer Sister   . Breast cancer Maternal Aunt   . Breast cancer Sister   . Colon cancer Neg Hx     Review of Systems  Constitutional: Negative for chills and fever.  Eyes: Negative for visual disturbance.  Respiratory: Positive for cough (allergy related). Negative for shortness of breath and wheezing.   Cardiovascular: Negative for chest pain, palpitations and leg swelling.  Gastrointestinal: Negative for abdominal pain, blood in stool, constipation, diarrhea and nausea.       No gerd  Genitourinary: Negative for dysuria.  Musculoskeletal: Negative for arthralgias and back pain.  Skin: Negative for color change and rash.  Neurological: Negative for light-headedness and headaches.  Psychiatric/Behavioral: Negative for dysphoric mood. The patient is not nervous/anxious.        Objective:   Vitals:   06/04/20 1433  BP: 126/70  Pulse: 80  Temp: 98 F (36.7 C)  SpO2: 98%   Filed Weights   06/04/20 1433  Weight: 178 lb (80.7 kg)   Body mass index is 27.88 kg/m.  BP Readings from Last 3 Encounters:  06/04/20 126/70  11/30/19 114/70  03/30/19 114/80    Wt Readings from Last 3 Encounters:  06/04/20 178 lb (80.7 kg)  11/30/19 176 lb (79.8 kg)  03/30/19 178 lb (80.7 kg)     Physical Exam Constitutional: She appears well-developed and well-nourished. No distress.  HENT:  Head: Normocephalic and atraumatic.  Right Ear: External ear normal. Normal ear canal and TM Left Ear: External ear normal.  Normal ear canal and TM Mouth/Throat: Oropharynx is clear and moist.  Eyes: Conjunctivae and EOM are normal.  Neck: Neck supple. No tracheal deviation present. No thyromegaly present.  No carotid bruit  Cardiovascular: Normal rate, regular rhythm and normal heart sounds.   No murmur heard.  No  edema. Pulmonary/Chest: Effort normal and breath sounds normal. No respiratory distress. She has no wheezes. She has no rales.  Breast: deferred   Abdominal: Soft. She exhibits no distension. There is no tenderness.  Lymphadenopathy: She has no cervical adenopathy.  Skin: Skin is warm and dry. She is not diaphoretic.  Psychiatric: She has a normal mood and affect. Her behavior is normal.   Lab Results  Component Value Date   WBC 6.4 05/30/2020   HGB 14.3 05/30/2020   HCT 42.4 05/30/2020   PLT 280.0 05/30/2020   GLUCOSE 96 05/30/2020   CHOL 271 (H)  05/30/2020   TRIG 134.0 05/30/2020   HDL 63.40 05/30/2020   LDLCALC 181 (H) 05/30/2020   ALT 19 05/30/2020   AST 17 05/30/2020   NA 140 05/30/2020   K 4.4 05/30/2020   CL 104 05/30/2020   CREATININE 0.81 05/30/2020   BUN 19 05/30/2020   CO2 28 05/30/2020   TSH 2.85 05/30/2020   The 10-year ASCVD risk score Mikey Bussing DC Jr., et al., 2013) is: 3.7%   Values used to calculate the score:     Age: 79 years     Sex: Female     Is Non-Hispanic African American: No     Diabetic: No     Tobacco smoker: No     Systolic Blood Pressure: 098 mmHg     Is BP treated: No     HDL Cholesterol: 63.4 mg/dL     Total Cholesterol: 271 mg/dL      Assessment & Plan:   Physical exam: Screening blood work    reviewed Immunizations  Flu vaccine done, discussed shingrix Colonoscopy  Done 05/2018 - Dr Collene Mares Mammogram  Up to date  Gyn  Up to date   - goes in Jan Eye exams  Up to date  Exercise  Regular walking - 4 miles  4 times a week, going to gym Weight  Work on weight loss Substance abuse  none      See Problem List for Assessment and Plan of chronic medical problems.

## 2020-06-03 NOTE — Patient Instructions (Addendum)
[place]  [place]  [place], [place] [address]  Main: [place] [address] [place], [place] [address] [phone]   [facility]   [facility] [phone]    No immunization administered today.   Medications changes include :   none     Please followup in 1 year    Health Maintenance, Female Adopting a healthy lifestyle and getting preventive care are important in promoting health and wellness. Ask your health care provider about:  The right schedule for you to have regular tests and exams.  Things you can do on your own to prevent diseases and keep yourself healthy. What should I know about diet, weight, and exercise? Eat a healthy diet   Eat a diet that includes plenty of vegetables, fruits, low-fat dairy products, and lean protein.  Do not eat a lot of foods that are high in solid fats, added sugars, or sodium. Maintain a healthy weight Body mass index (BMI) is used to identify weight problems. It estimates body fat based on height and weight. Your health care provider can help determine your BMI and help you achieve or maintain a healthy weight. Get regular exercise Get regular exercise. This is one of the most important things you can do for your health. Most adults should:  Exercise for at least 150 minutes each week. The exercise should increase your heart rate and make you sweat (moderate-intensity exercise).  Do strengthening exercises at least twice a week. This is in addition to the moderate-intensity exercise.  Spend less time sitting. Even light physical activity can be beneficial. Watch cholesterol and blood lipids Have your blood tested for lipids and cholesterol at 60 years of age, then have this test every 5 years. Have your cholesterol levels checked more often if:  Your lipid or cholesterol levels  are high.  You are older than 60 years of age.  You are at high risk for heart disease. What should I know about cancer screening? Depending on your health history and family history, you may need to have cancer screening at various ages. This may include screening for:  Breast cancer.  Cervical cancer.  Colorectal cancer.  Skin cancer.  Lung cancer. What should I know about heart disease, diabetes, and high blood pressure? Blood pressure and heart disease  High blood pressure causes heart disease and increases the risk of stroke. This is more likely to develop in people who have high blood pressure readings, are of African descent, or are overweight.  Have your blood pressure checked: ? Every 3-5 years if you are 45-68 years of age. ? Every year if you are 39 years old or older. Diabetes Have regular diabetes screenings. This checks your fasting blood sugar level. Have the screening done:  Once every three years after age 79 if you are at a normal weight and have a low risk for diabetes.  More often and at a younger age if you are overweight or have a high risk for diabetes. What should I know about preventing infection? Hepatitis B If you have a higher risk for hepatitis B, you should be screened for this virus. Talk with your health care provider to find out if you are at risk for hepatitis B infection. Hepatitis C Testing is recommended for:  Everyone born from [birth year] through 1966.  Anyone with known risk factors for hepatitis C. Sexually transmitted infections (  STIs)  Get screened for STIs, including gonorrhea and chlamydia, if: ? You are sexually active and are younger than 60 years of age. ? You are older than 60 years of age and your health care provider tells you that you are at risk for this type of infection. ? Your sexual activity has changed since you were last screened, and you are at increased risk for chlamydia or gonorrhea. Ask your health care provider if  you are at risk.  Ask your health care provider about whether you are at high risk for HIV. Your health care provider may recommend a prescription medicine to help prevent HIV infection. If you choose to take medicine to prevent HIV, you should first get tested for HIV. You should then be tested every 3 months for as long as you are taking the medicine. Pregnancy  If you are about to stop having your period (premenopausal) and you may become pregnant, seek counseling before you get pregnant.  Take 400 to 800 micrograms (mcg) of folic acid every day if you become pregnant.  Ask for birth control (contraception) if you want to prevent pregnancy. Osteoporosis and menopause Osteoporosis is a disease in which the bones lose minerals and strength with aging. This can result in bone fractures. If you are 79 years old or older, or if you are at risk for osteoporosis and fractures, ask your health care provider if you should:  Be screened for bone loss.  Take a calcium or vitamin D supplement to lower your risk of fractures.  Be given hormone replacement therapy (HRT) to treat symptoms of menopause. Follow these instructions at home: Lifestyle  Do not use any products that contain nicotine or tobacco, such as cigarettes, e-cigarettes, and chewing tobacco. If you need help quitting, ask your health care provider.  Do not use street drugs.  Do not share needles.  Ask your health care provider for help if you need support or information about quitting drugs. Alcohol use  Do not drink alcohol if: ? Your health care provider tells you not to drink. ? You are pregnant, may be pregnant, or are planning to become pregnant.  If you drink alcohol: ? Limit how much you use to 0-1 drink a day. ? Limit intake if you are breastfeeding.  Be aware of how much alcohol is in your drink. In the [place] one drink equals one 12 oz bottle of beer (355 mL), one 5 oz glass of wine (148 mL), or one 1 oz glass of  hard liquor (44 mL). General instructions  Schedule regular health, dental, and eye exams.  Stay current with your vaccines.  Tell your health care provider if: ? You often feel depressed. ? You have ever been abused or do not feel safe at home. Summary  Adopting a healthy lifestyle and getting preventive care are important in promoting health and wellness.  Follow your health care provider's instructions about healthy diet, exercising, and getting tested or screened for diseases.  Follow your health care provider's instructions on monitoring your cholesterol and blood pressure. This information is not intended to replace advice given to you by your health care provider. Make sure you discuss any questions you have with your health care provider. Document Revised: 06/23/2018 Document Reviewed: 06/23/2018 Elsevier Patient Education  2020 [employer].

## 2020-06-04 ENCOUNTER — Encounter: Payer: Self-pay | Admitting: Internal Medicine

## 2020-06-04 ENCOUNTER — Other Ambulatory Visit: Payer: Self-pay

## 2020-06-04 ENCOUNTER — Ambulatory Visit (INDEPENDENT_AMBULATORY_CARE_PROVIDER_SITE_OTHER): Payer: 59 | Admitting: Internal Medicine

## 2020-06-04 VITALS — BP 126/70 | HR 80 | Temp 98.0°F | Ht 67.0 in | Wt 178.0 lb

## 2020-06-04 DIAGNOSIS — J309 Allergic rhinitis, unspecified: Secondary | ICD-10-CM

## 2020-06-04 DIAGNOSIS — F419 Anxiety disorder, unspecified: Secondary | ICD-10-CM

## 2020-06-04 DIAGNOSIS — M542 Cervicalgia: Secondary | ICD-10-CM

## 2020-06-04 DIAGNOSIS — E782 Mixed hyperlipidemia: Secondary | ICD-10-CM

## 2020-06-04 DIAGNOSIS — C187 Malignant neoplasm of sigmoid colon: Secondary | ICD-10-CM

## 2020-06-04 DIAGNOSIS — Z0000 Encounter for general adult medical examination without abnormal findings: Secondary | ICD-10-CM | POA: Diagnosis not present

## 2020-06-04 DIAGNOSIS — G8929 Other chronic pain: Secondary | ICD-10-CM

## 2020-06-04 DIAGNOSIS — Z85038 Personal history of other malignant neoplasm of large intestine: Secondary | ICD-10-CM

## 2020-06-04 NOTE — Assessment & Plan Note (Signed)
Chronic, intermittent Really related to her good friend/stepmother being diagnosed with recurrence of her ovarian cancer Has alprazolam to take as needed-has only taken 1 pill in the last 6 months Overall doing very well with this and no medication is needed

## 2020-06-04 NOTE — Assessment & Plan Note (Signed)
History of colon cancer Colonoscopy up-to-date-we will get most recent colonoscopy
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
## 2020-06-04 NOTE — Assessment & Plan Note (Signed)
Chronic Seasonal Taking Allegra-D and Flonase-continue

## 2020-06-04 NOTE — Assessment & Plan Note (Signed)
Chronic Lipids stable Lifestyle controlled ASCVD risk below treatment threshold Discussed possibly coronary artery CT next year to better define risk Continue regular exercise Discussed heart healthy diet No treatment needed at this time

## 2020-06-05 ENCOUNTER — Other Ambulatory Visit: Payer: Self-pay | Admitting: Internal Medicine

## 2020-06-12 ENCOUNTER — Other Ambulatory Visit: Payer: Self-pay

## 2020-06-12 ENCOUNTER — Encounter: Payer: Self-pay | Admitting: Internal Medicine

## 2020-06-12 ENCOUNTER — Ambulatory Visit (INDEPENDENT_AMBULATORY_CARE_PROVIDER_SITE_OTHER): Payer: 59 | Admitting: Internal Medicine

## 2020-06-12 ENCOUNTER — Ambulatory Visit (INDEPENDENT_AMBULATORY_CARE_PROVIDER_SITE_OTHER): Payer: 59

## 2020-06-12 ENCOUNTER — Other Ambulatory Visit: Payer: Self-pay | Admitting: Internal Medicine

## 2020-06-12 VITALS — BP 132/80 | HR 70 | Temp 98.0°F | Ht 67.0 in

## 2020-06-12 DIAGNOSIS — M25571 Pain in right ankle and joints of right foot: Secondary | ICD-10-CM | POA: Insufficient documentation

## 2020-06-12 DIAGNOSIS — S8261XA Displaced fracture of lateral malleolus of right fibula, initial encounter for closed fracture: Secondary | ICD-10-CM

## 2020-06-12 NOTE — Assessment & Plan Note (Signed)
Acute 6 days ago fell off the porch-fell off 2 steps and twisted her ankle Sprain versus fracture Swelling, bruising and pain has improved a little, but is still there and I am concerned about the possibility of a fracture Stat x-ray today Continue elevating, compression, using crutches and taking ibuprofen as needed Further management based on x-ray

## 2020-06-12 NOTE — Patient Instructions (Addendum)
Have an xray today.

## 2020-06-12 NOTE — Progress Notes (Signed)
Ref

## 2020-06-12 NOTE — Progress Notes (Signed)
Subjective:    Patient ID: Miranda Guerra, female    DOB: December 24, 1960, 59 y.o.   MRN: [id number]  HPI The patient is here for an acute visit.   Ankle sprain - one week ago, last Wednesday she accidentally stepped off the porch and fell off 2 steps.  She sprained her right ankle.  She denies any other injuries.  She did have bruising, swelling and pain in the ankle and foot.  She stayed off her foot for 4 days and elevated it, applied compression and iced it.  She has been taking ibuprofen, but has not needed to take the ibuprofen today.  She has been using crutches.  Today the ankle feels better and she was able to put a little bit of weight on it.  Most of the pain is concentrated in the lateral ankle.  The swelling and bruising have improved, but are still present.    Medications and allergies reviewed with patient and updated if appropriate.  Patient Active Problem List   Diagnosis Date Noted  . Anxiety 11/30/2019  . Chronic neck pain 06/18/2018  . Hyperlipidemia 09/18/2017  . Allergic rhinitis 09/10/2017  . History of colon cancer 10/01/2012    Current Outpatient Medications on File Prior to Visit  Medication Sig Dispense Refill  . ALLEGRA-D ALLERGY & CONGESTION 60-120 MG 12 hr tablet Take 1 tablet by mouth 2 (two) times daily. 180 tablet 1  . ALPRAZolam (XANAX) 0.25 MG tablet Take 1 tablet (0.25 mg total) by mouth 2 (two) times daily as needed for anxiety. 30 tablet 0  . Cholecalciferol (VITAMIN D) 50 MCG (2000 UT) CAPS Take by mouth.    . fluticasone (FLONASE) 50 MCG/ACT nasal spray SPRAY 2 SPRAYS INTO EACH NOSTRIL EVERY DAY 48 mL 3  . Multiple Vitamin (MULTIVITAMIN ADULT PO) Take by mouth.    . Sulfacetamide Sodium-Sulfur (AVAR-E EMOLLIENT) 10-5 % CREA Avar-E 10 %-5 % (w/w) topical cream     No current facility-administered medications on file prior to visit.    Past Medical History:  Diagnosis Date  . Allergic rhinitis   . Colon polyps   . Hyperlipidemia      Past Surgical History:  Procedure Laterality Date  . COLONOSCOPY    . CYSTECTOMY    . fallopian tube removal    . OOPHORECTOMY    . POLYPECTOMY      Social History   Socioeconomic History  . Marital status: Married    Spouse name: Not on file  . Number of children: 0  . Years of education: Not on file  . Highest education level: Not on file  Occupational History  . Not on file  Tobacco Use  . Smoking status: Former Smoker    Quit date: 07/15/1987    Years since quitting: 32.9  . Smokeless tobacco: Never Used  Substance and Sexual Activity  . Alcohol use: Yes    Alcohol/week: 4.0 standard drinks    Types: 4 Glasses of wine per week  . Drug use: No  . Sexual activity: Never  Other Topics Concern  . Not on file  Social History Narrative   Exercises 4/week - gym   Married , no children   Social Determinants of Health   Financial Resource Strain:   . Difficulty of Paying Living Expenses: Not on file  Food Insecurity:   . Worried About Charity fundraiser in the Last Year: Not on file  . Ran Out of Food in the Last  Year: Not on file  Transportation Needs:   . Lack of Transportation (Medical): Not on file  . Lack of Transportation (Non-Medical): Not on file  Physical Activity:   . Days of Exercise per Week: Not on file  . Minutes of Exercise per Session: Not on file  Stress:   . Feeling of Stress : Not on file  Social Connections:   . Frequency of Communication with Friends and Family: Not on file  . Frequency of Social Gatherings with Friends and Family: Not on file  . Attends Religious Services: Not on file  . Active Member of Clubs or Organizations: Not on file  . Attends Archivist Meetings: Not on file  . Marital Status: Not on file    Family History  Problem Relation Age of Onset  . Alzheimer's disease Mother   . Hypertension Mother   . Transient ischemic attack Mother   . Stroke Mother   . Heart disease Maternal Grandfather   .  Emphysema Maternal Grandfather   . Hyperlipidemia Maternal Grandfather   . Hypertension Maternal Grandfather   . Lung cancer Paternal Grandmother   . Emphysema Paternal Grandfather   . Heart disease Paternal Grandfather   . Colon polyps Father   . Hyperlipidemia Father   . Hearing loss Father   . Hypertension Maternal Grandmother   . Breast cancer Sister   . Breast cancer Maternal Aunt   . Breast cancer Sister   . Colon cancer Neg Hx     Review of Systems  Constitutional: Negative for fever.  Musculoskeletal: Positive for joint swelling.  Skin: Positive for color change. Negative for wound.  Neurological: Negative for numbness.       Objective:   Vitals:   06/12/20 1552  BP: 132/80  Pulse: 70  Temp: 98 F (36.7 C)  SpO2: 98%   BP Readings from Last 3 Encounters:  06/12/20 132/80  06/04/20 126/70  11/30/19 114/70   Wt Readings from Last 3 Encounters:  06/04/20 178 lb (80.7 kg)  11/30/19 176 lb (79.8 kg)  03/30/19 178 lb (80.7 kg)   Body mass index is 27.88 kg/m.   Physical Exam Constitutional:      General: She is not in acute distress.    Appearance: Normal appearance. She is not ill-appearing.  HENT:     Head: Normocephalic and atraumatic.  Musculoskeletal:     Comments: Right ankle and foot with mild swelling, bruising bilateral aspects of lower ankle and distal foot near MTP joints.  Tenderness with palpation lateral malleolus.  Mild tenderness medial aspect of ankle and posterior lateral malleolus.  No Achilles tenderness.  No foot tenderness.  Neurovascularly intact  Skin:    General: Skin is warm and dry.  Neurological:     Mental Status: She is alert.            Assessment & Plan:    See Problem List for Assessment and Plan of chronic medical problems.    This visit occurred during the SARS-CoV-2 public health emergency.  Safety protocols were in place, including screening questions prior to the visit, additional usage of staff PPE, and  extensive cleaning of exam room while observing appropriate contact time as indicated for disinfecting solutions.

## 2020-06-14 ENCOUNTER — Ambulatory Visit (INDEPENDENT_AMBULATORY_CARE_PROVIDER_SITE_OTHER): Payer: 59 | Admitting: Physician Assistant

## 2020-06-14 ENCOUNTER — Encounter: Payer: Self-pay | Admitting: Physician Assistant

## 2020-06-14 DIAGNOSIS — S93411A Sprain of calcaneofibular ligament of right ankle, initial encounter: Secondary | ICD-10-CM

## 2020-06-14 NOTE — Progress Notes (Signed)
Office Visit Note   Patient: Miranda Guerra           Date of Birth: 07-Apr-1961           MRN: [id number] Visit Date: 06/14/2020              Requested by: Binnie Rail, MD [place],  [place] [id number] PCP: Binnie Rail, MD   Assessment & Plan: Visit Diagnoses:  1. Sprain of calcaneofibular ligament of right ankle, initial encounter     Plan: Placed her in a cam walker boot.  Like to see her back in 3 weeks to see how she is doing overall most likely at that point time we will place her in an ASO brace and send her to formal physical therapy for weaning out of the ASO proprioception gait balance and strengthening of the right ankle.  Questions were encouraged and answered.  She is weightbearing as tolerated in the cam walker boot.  Elevation wiggling toes encouraged.  No radiographs at return.  Follow-Up Instructions: Return in about 3 weeks (around 07/05/2020).   Orders:  No orders of the defined types were placed in this encounter.  No orders of the defined types were placed in this encounter.     Procedures: No procedures performed   Clinical Data: No additional findings.   Subjective: Chief Complaint  Patient presents with   Right Ankle - Pain    HPI Miranda Guerra comes into today due to fall last Wednesday.  She reports she fell off 2 steps injuring her right ankle.  She is seen by her primary care physician who obtained radiographs of her right ankle.  These were done on 06/12/2020.  I personally reviewed the films.  They show a small avulsion type fracture off the most distal portion of the fibula.  Otherwise the ankle is well located.  No evidence of diastases.  Medial malleolus without acute findings there are calcifications consistent with old ankle sprains in the area of the medial malleolus gutter. She has been using crutches to ambulate but has not been placed in a cam walker boot or any type of bracing.  She does report an injury to her  ankle while in [place] early November in which she injured the ankle while horseback riding. Review of Systems See HPI otherwise negative  Objective: Vital Signs: LMP 12/12/2012   Physical Exam Constitutional:      Appearance: She is not ill-appearing or diaphoretic.  Cardiovascular:     Pulses: Normal pulses.  Pulmonary:     Effort: Pulmonary effort is normal.  Neurological:     Mental Status: She is alert and oriented to person, place, and time.  Psychiatric:        Mood and Affect: Mood normal.     Ortho Exam Right ankle she has tenderness over the distal malleolus.  She has significant bruising distal tib-fib and into the foot.  She is able to dorsiflex and plantarflex ankle.  Calf supple nontender.  She has no tenderness over the proximal tib-fib. Specialty Comments:  No specialty comments available.  Imaging: No results found.   PMFS History: Patient Active Problem List   Diagnosis Date Noted   Acute right ankle pain 06/12/2020   Anxiety 11/30/2019   Chronic neck pain 06/18/2018   Hyperlipidemia 09/18/2017   Allergic rhinitis 09/10/2017   History of colon cancer 10/01/2012   Past Medical History:  Diagnosis Date   Allergic rhinitis  Colon polyps    Hyperlipidemia     Family History  Problem Relation Age of Onset   Alzheimer's disease Mother    Hypertension Mother    Transient ischemic attack Mother    Stroke Mother    Heart disease Maternal Grandfather    Emphysema Maternal Grandfather    Hyperlipidemia Maternal Grandfather    Hypertension Maternal Grandfather    Lung cancer Paternal Grandmother    Emphysema Paternal Grandfather    Heart disease Paternal Grandfather    Colon polyps Father    Hyperlipidemia Father    Hearing loss Father    Hypertension Maternal Grandmother    Breast cancer Sister    Breast cancer Maternal Aunt    Breast cancer Sister    Colon cancer Neg Hx     Past Surgical History:  Procedure  Laterality Date   COLONOSCOPY     CYSTECTOMY     fallopian tube removal     OOPHORECTOMY     POLYPECTOMY     Social History   Occupational History   Not on file  Tobacco Use   Smoking status: Former Smoker    Quit date: 07/15/1987    Years since quitting: 32.9   Smokeless tobacco: Never Used  Substance and Sexual Activity   Alcohol use: Yes    Alcohol/week: 4.0 standard drinks    Types: 4 Glasses of wine per week   Drug use: No   Sexual activity: Never

## 2020-07-05 ENCOUNTER — Other Ambulatory Visit: Payer: Self-pay

## 2020-07-05 ENCOUNTER — Encounter: Payer: Self-pay | Admitting: Orthopaedic Surgery

## 2020-07-05 ENCOUNTER — Ambulatory Visit (INDEPENDENT_AMBULATORY_CARE_PROVIDER_SITE_OTHER): Payer: 59 | Admitting: Orthopaedic Surgery

## 2020-07-05 DIAGNOSIS — S93431D Sprain of tibiofibular ligament of right ankle, subsequent encounter: Secondary | ICD-10-CM | POA: Diagnosis not present

## 2020-07-05 DIAGNOSIS — S93411A Sprain of calcaneofibular ligament of right ankle, initial encounter: Secondary | ICD-10-CM

## 2020-07-05 NOTE — Progress Notes (Signed)
The patient comes in today for follow-up having sustained a severe right ankle sprain about 4 weeks ago.  She still has significant mild swelling globally around her ankle.  She had very minimal avulsion at the distal tip of the fibula and this is more representative of a quite severe ankle sprain.  She has been weightbearing as tolerated in a cam walking boot.  Examination of the boot does still show global swelling around her ankle.  Her range of motion is almost full in the ankle feels stable but it is stiff and painful.  She is neurovascular intact.  I did explain to her that severe ankle sprains like this can take a while to completely recover from.  She can transition to an ASO and should wear that for the next several weeks until she gets comfortable being out of it.  She does not need to wear it when she sleeps.  She works closely with some type of chiropractor who does perform therapy and even works with Pilates.  I am fine with her working with the chiropractor for her ankle.  I did show her some exercises to try as well.  All questions and concerns were answered and addressed.  Follow-up is as needed.

## 2020-11-06 ENCOUNTER — Telehealth: Payer: Self-pay | Admitting: Internal Medicine

## 2020-11-06 NOTE — Telephone Encounter (Signed)
[facility] is good -- Dr Paulla Dolly or Dr Amalia Hailey - both are very good.

## 2020-11-06 NOTE — Telephone Encounter (Signed)
    Miranda Guerra is calling to request the name of a Podiatrist in the area. This request is not for her, but her stepmother , who is not a patient. [name] advised scheduler to "send the message [name] knows about her stepmother with cancer"

## 2020-11-06 NOTE — Telephone Encounter (Signed)
Spoke with patient today and recommendations given. °

## 2020-12-14 ENCOUNTER — Telehealth: Payer: Self-pay | Admitting: Internal Medicine

## 2020-12-14 DIAGNOSIS — L600 Ingrowing nail: Secondary | ICD-10-CM

## 2020-12-14 NOTE — Telephone Encounter (Signed)
    Patient requesting referral to Podiatrist; ingrown toenail No preference

## 2021-01-23 ENCOUNTER — Telehealth: Payer: Self-pay | Admitting: *Deleted

## 2021-01-23 NOTE — Telephone Encounter (Signed)
Patient is calling with some concerns about visit for a possible  ingrown nail procedure in upcoming week. She wants to know if she can drive afterwards, how long will the procedure take and if it will be painful. Explained that it should take about 2 hours, and may choose not  to drive a few hours after the procedure,also that she should keep appointment to have it evaluated to make sure that it is actually the ingrown. She verbalized understanding.

## 2021-01-27 ENCOUNTER — Other Ambulatory Visit: Payer: Self-pay | Admitting: Internal Medicine

## 2021-01-28 ENCOUNTER — Other Ambulatory Visit: Payer: Self-pay

## 2021-01-28 ENCOUNTER — Encounter: Payer: Self-pay | Admitting: Podiatry

## 2021-01-28 ENCOUNTER — Ambulatory Visit (INDEPENDENT_AMBULATORY_CARE_PROVIDER_SITE_OTHER): Payer: 59 | Admitting: Podiatry

## 2021-01-28 DIAGNOSIS — M79674 Pain in right toe(s): Secondary | ICD-10-CM

## 2021-01-28 DIAGNOSIS — L600 Ingrowing nail: Secondary | ICD-10-CM

## 2021-01-30 NOTE — Progress Notes (Signed)
Subjective:   Patient ID: Miranda Guerra, female   DOB: 60 y.o.   MRN: [id number]   HPI 60 year old female presents the office today for concerns of a painful ingrown toenail right big toe, lateral aspect.  Also does note some discoloration on the nail per.  If it does hurt with shoe gear.  No drainage or pus at this time.  History of right ankle fracture November 2021.   Review of Systems  All other systems reviewed and are negative.  Past Medical History:  Diagnosis Date   Allergic rhinitis    Colon polyps    Hyperlipidemia     Past Surgical History:  Procedure Laterality Date   COLONOSCOPY     CYSTECTOMY     fallopian tube removal     OOPHORECTOMY     POLYPECTOMY       Current Outpatient Medications:    ALLEGRA-D ALLERGY & CONGESTION 60-120 MG 12 hr tablet, TAKE 1 TABLET BY MOUTH TWICE A DAY, Disp: 180 tablet, Rfl: 1   ALPRAZolam (XANAX) 0.25 MG tablet, Take 1 tablet (0.25 mg total) by mouth 2 (two) times daily as needed for anxiety., Disp: 30 tablet, Rfl: 0   Cholecalciferol (VITAMIN D) 50 MCG (2000 UT) CAPS, Take by mouth., Disp: , Rfl:    fluticasone (FLONASE) 50 MCG/ACT nasal spray, SPRAY 2 SPRAYS INTO EACH NOSTRIL EVERY DAY, Disp: 48 mL, Rfl: 3   Multiple Vitamin (MULTIVITAMIN ADULT PO), Take by mouth., Disp: , Rfl:    Sulfacetamide Sodium-Sulfur (AVAR-E EMOLLIENT) 10-5 % CREA, Avar-E 10 %-5 % (w/w) topical cream, Disp: , Rfl:   Allergies  Allergen Reactions   Methocarbamol     nausea          Objective:  Physical Exam  General: AAO x3, NAD  Dermatological: Incurvation present to the right lateral hallux nail border with localized edema and faint erythema likely more from inflammation as opposed to infection but is no drainage or pus or ascending cellulitis.  No open lesions.  Vascular: Dorsalis Pedis artery and Posterior Tibial artery pedal pulses are 2/4 bilateral with immedate capillary fill time. There is no pain with calf compression, swelling,  warmth, erythema.   Neruologic: Grossly intact via light touch bilateral.   Musculoskeletal: Tenderness on the ingrown toenail but no other areas of discomfort muscular strength 5/5 in all groups tested bilateral.  Gait: Unassisted, Nonantalgic.       Assessment:   Right ingrown toenail     Plan:  -Treatment options discussed including all alternatives, risks, and complications -Etiology of symptoms were discussed -At this time, the patient is requesting partial nail removal with chemical matricectomy to the symptomatic portion of the nail. Risks and complications were discussed with the patient for which they understand and written consent was obtained. Under sterile conditions a total of 3 mL of a mixture of 2% lidocaine plain and 0.5% Marcaine plain was infiltrated in a hallux block fashion. Once anesthetized, the skin was prepped in sterile fashion. A tourniquet was then applied. Next the lateral aspect of hallux nail border was then sharply excised making sure to remove the entire offending nail border. Once the nails were ensured to be removed area was debrided and the underlying skin was intact. There is no purulence identified in the procedure. Next phenol was then applied under standard conditions and copiously irrigated. Silvadene was applied. A dry sterile dressing was applied. After application of the dressing the tourniquet was removed and there is found to be  an immediate capillary refill time to the digit. The patient tolerated the procedure well any complications. Post procedure instructions were discussed the patient for which he verbally understood. Follow-up in one week for nail check or sooner if any problems are to arise. Discussed signs/symptoms of infection and directed to call the office immediately should any occur or go directly to the emergency room. In the meantime, encouraged to call the office with any questions, concerns, changes symptoms.  Trula Slade DPM

## 2021-02-13 DIAGNOSIS — R42 Dizziness and giddiness: Secondary | ICD-10-CM | POA: Insufficient documentation

## 2021-02-13 NOTE — Progress Notes (Signed)
Virtual Visit via Video Note  I connected with Miranda Guerra on 02/13/21 at  7:50 AM EDT by a video enabled telemedicine application and verified that I am speaking with the correct person using two identifiers.   I discussed the limitations of evaluation and management by telemedicine and the availability of in person appointments. The patient expressed understanding and agreed to proceed.  Present for the visit:  Myself, Dr Billey Gosling, Dyann Ruddle.  The patient is currently at home and I am in the office.    No referring provider.    History of Present Illness: This is an acute visit for dizziness  Two weeks ago she started having shotoing pains in left temple.  This is something she has had before and even her mother and sister have it-it is related to allergies.  Sunday morning she woke up with dizziness - felt like she had been on a boat.  Monday and Tuesday morning it was the same thing.  She is taking muxinex dm, doing flonase and allertek.  As of yesterday afternoon she has not been as dizzy and this morning it was minimal.  She thinks it is all related to allergies, but wanted to make sure.  It is slowly getting better.  She has not had dizziness in the past like this.  It is worse with looking and bending down.    She took a covid test and it was negative.  Review of Systems  Constitutional:  Negative for fever.  HENT:  Negative for congestion, sinus pain and sore throat.   Eyes:        Denies vision changes  Respiratory:  Negative for cough.   Cardiovascular:  Negative for chest pain and palpitations.  Gastrointestinal:  Negative for nausea.  Neurological:  Positive for dizziness and headaches (left temple pain - intemrittent - allergy related  - genetic). Negative for tingling and weakness.     Social History   Socioeconomic History   Marital status: Married    Spouse name: Not on file   Number of children: 0   Years of education: Not on file   Highest  education level: Not on file  Occupational History   Not on file  Tobacco Use   Smoking status: Former    Types: Cigarettes    Quit date: 07/15/1987    Years since quitting: 33.6   Smokeless tobacco: Never  Substance and Sexual Activity   Alcohol use: Yes    Alcohol/week: 4.0 standard drinks    Types: 4 Glasses of wine per week   Drug use: No   Sexual activity: Never  Other Topics Concern   Not on file  Social History Narrative   Exercises 4/week - gym   Married , no children   Social Determinants of Health   Financial Resource Strain: Not on file  Food Insecurity: Not on file  Transportation Needs: Not on file  Physical Activity: Not on file  Stress: Not on file  Social Connections: Not on file     Observations/Objective: Appears well in NAD Breathing normally  Assessment and Plan:  See Problem List for Assessment and Plan of chronic medical problems.   Follow Up Instructions:    I discussed the assessment and treatment plan with the patient. The patient was provided an opportunity to ask questions and all were answered. The patient agreed with the plan and demonstrated an understanding of the instructions.   The patient was advised to call back or seek  an in-person evaluation if the symptoms worsen or if the condition fails to improve as anticipated.    Binnie Rail, MD

## 2021-02-14 ENCOUNTER — Encounter: Payer: Self-pay | Admitting: Internal Medicine

## 2021-02-14 ENCOUNTER — Telehealth (INDEPENDENT_AMBULATORY_CARE_PROVIDER_SITE_OTHER): Payer: 59 | Admitting: Internal Medicine

## 2021-02-14 DIAGNOSIS — R42 Dizziness and giddiness: Secondary | ICD-10-CM | POA: Diagnosis not present

## 2021-02-14 NOTE — Assessment & Plan Note (Signed)
Acute Symptoms slowly improving Discussed several of the causes of dizziness and she does not have any concerning symptoms of a stroke, infection or dehydration Symptoms suggestive of BPV-symptoms seem to be worse with looking down, bending down Continue allergy medications, which may be helping Discussed that she can try taking Benadryl at night to see if that helps calm things down Discussed that she can try nondrowsy Dramamine over-the-counter Since symptoms are not improving and mild at this time we will hold off on any prescription medications and she will try the above-call if symptoms do not improve

## 2021-02-18 ENCOUNTER — Encounter: Payer: Self-pay | Admitting: Podiatry

## 2021-02-18 ENCOUNTER — Other Ambulatory Visit: Payer: Self-pay

## 2021-02-18 ENCOUNTER — Ambulatory Visit (INDEPENDENT_AMBULATORY_CARE_PROVIDER_SITE_OTHER): Payer: 59 | Admitting: Podiatry

## 2021-02-18 DIAGNOSIS — L600 Ingrowing nail: Secondary | ICD-10-CM

## 2021-02-18 DIAGNOSIS — K5730 Diverticulosis of large intestine without perforation or abscess without bleeding: Secondary | ICD-10-CM | POA: Insufficient documentation

## 2021-02-18 DIAGNOSIS — Z8371 Family history of colonic polyps: Secondary | ICD-10-CM | POA: Insufficient documentation

## 2021-02-18 NOTE — Patient Instructions (Signed)

## 2021-02-20 NOTE — Progress Notes (Signed)
Subjective: Miranda Guerra is a 60 y.o.  female returns to office today for follow up evaluation after having right Hallux partial nail avulsion performed. Patient has been soaking using epsom salts and applying topical antibiotic covered with bandaid daily.  States that healing well any new concerns.  No drainage or pus.  Patient denies fevers, chills, nausea, vomiting. Denies any calf pain, chest pain, SOB.   Objective:  General: Well developed, nourished, in no acute distress, alert and oriented x3   Dermatology: Skin is warm, dry and supple bilateral. Right  hallux nail border appears to be clean, dry, with mild granular tissue and surrounding scab. There is no surrounding erythema, edema, drainage/purulence. The remaining nails appear unremarkable at this time. There are no other lesions or other signs of infection present.  Neurovascular status: Intact. No lower extremity swelling; No pain with calf compression bilateral.  Musculoskeletal: Decreased tenderness to palpation of the right hallux nail fold. Muscular strength within normal limits bilateral.   Assesement and Plan: S/p partial nail avulsion, doing well.   -Continue soaking in epsom salts twice a day followed by antibiotic ointment and a band-aid. Can leave uncovered at night. Continue this until completely healed.  -If the area has not healed in 2 weeks, call the office for follow-up appointment, or sooner if any problems arise.  -Monitor for any signs/symptoms of infection. Call the office immediately if any occur or go directly to the emergency room. Call with any questions/concerns.  Celesta Gentile, DPM

## 2021-03-09 IMAGING — DX DG ANKLE COMPLETE 3+V*R*
3 series · 3 of 3 positions shown · non-contrast
Comparison: None.

CLINICAL DATA: Acute right ankle pain after injury last week.

EXAM:
RIGHT ANKLE - COMPLETE 3+ VIEW

[ankle ap]
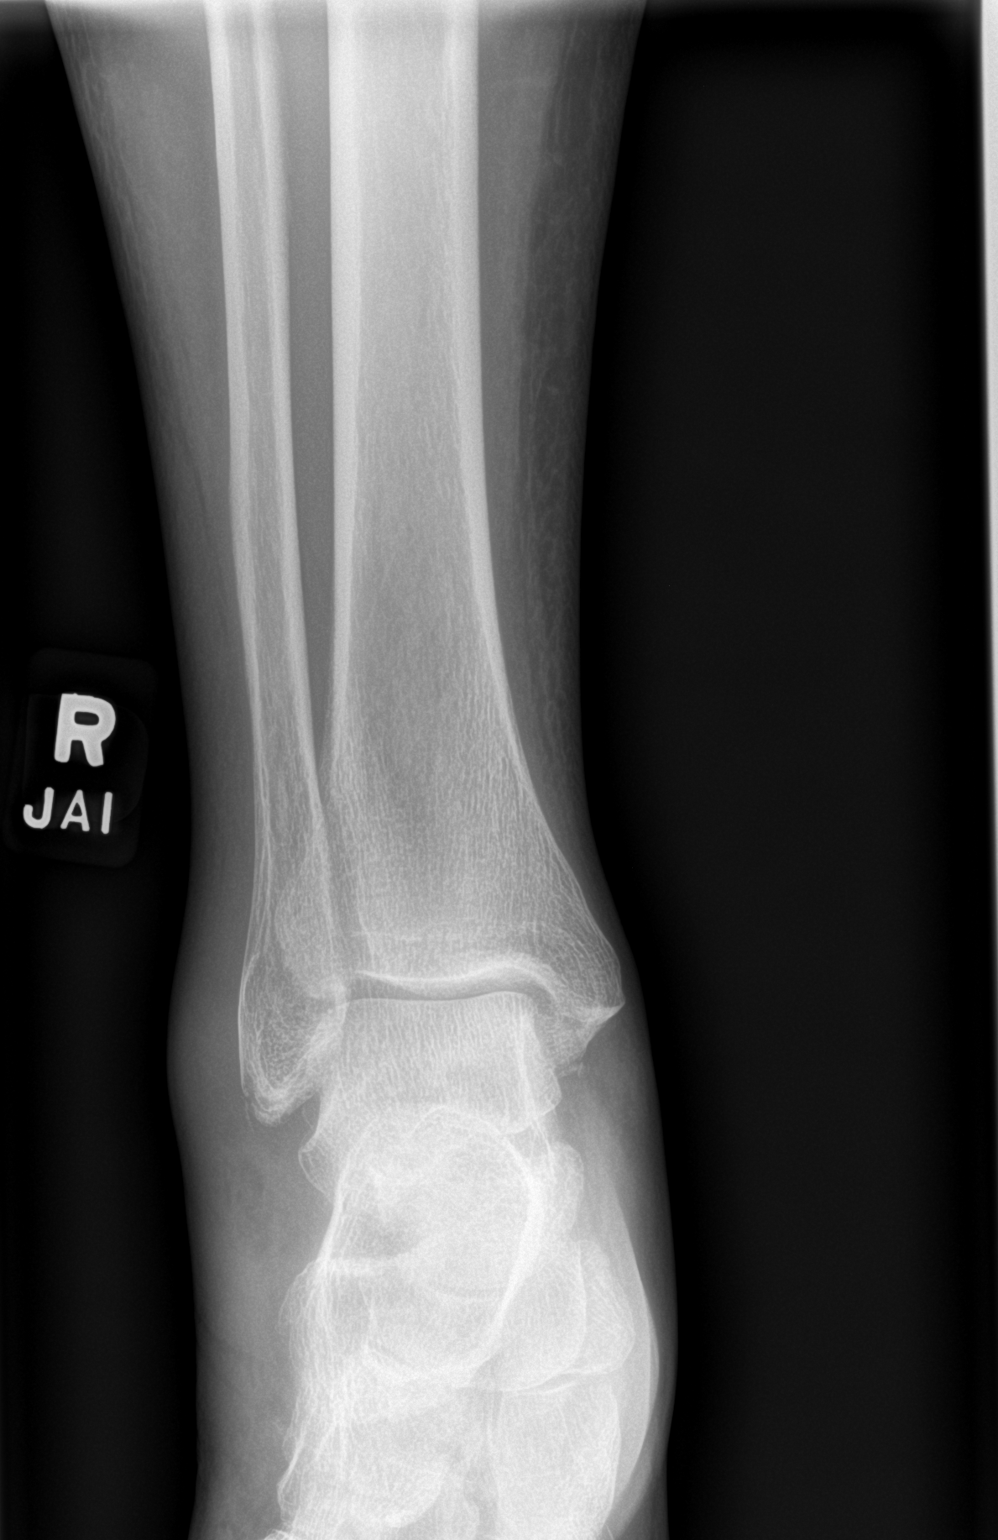

[ankle obl]
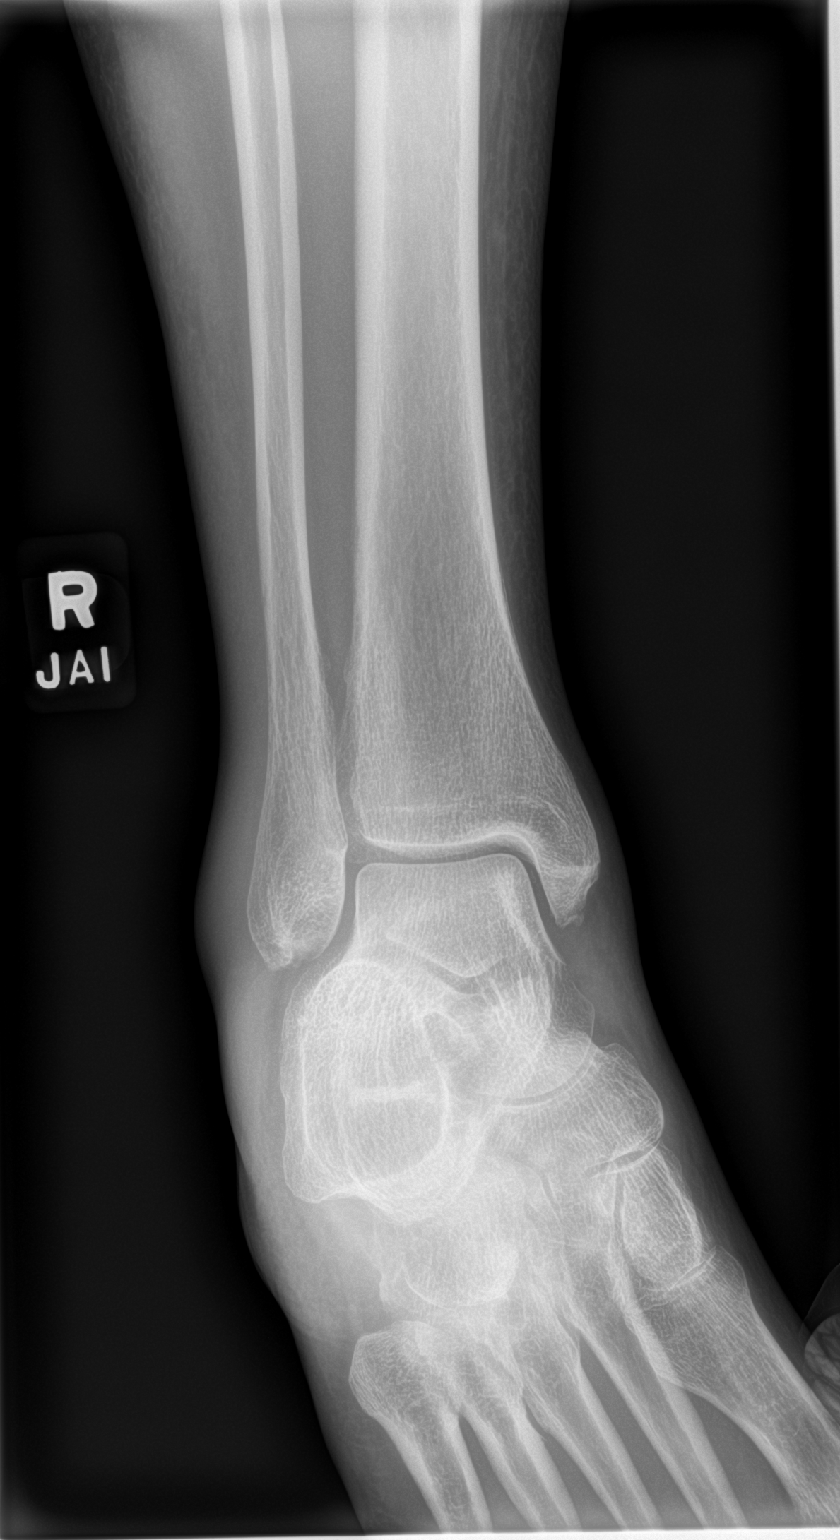

[ankle lat]
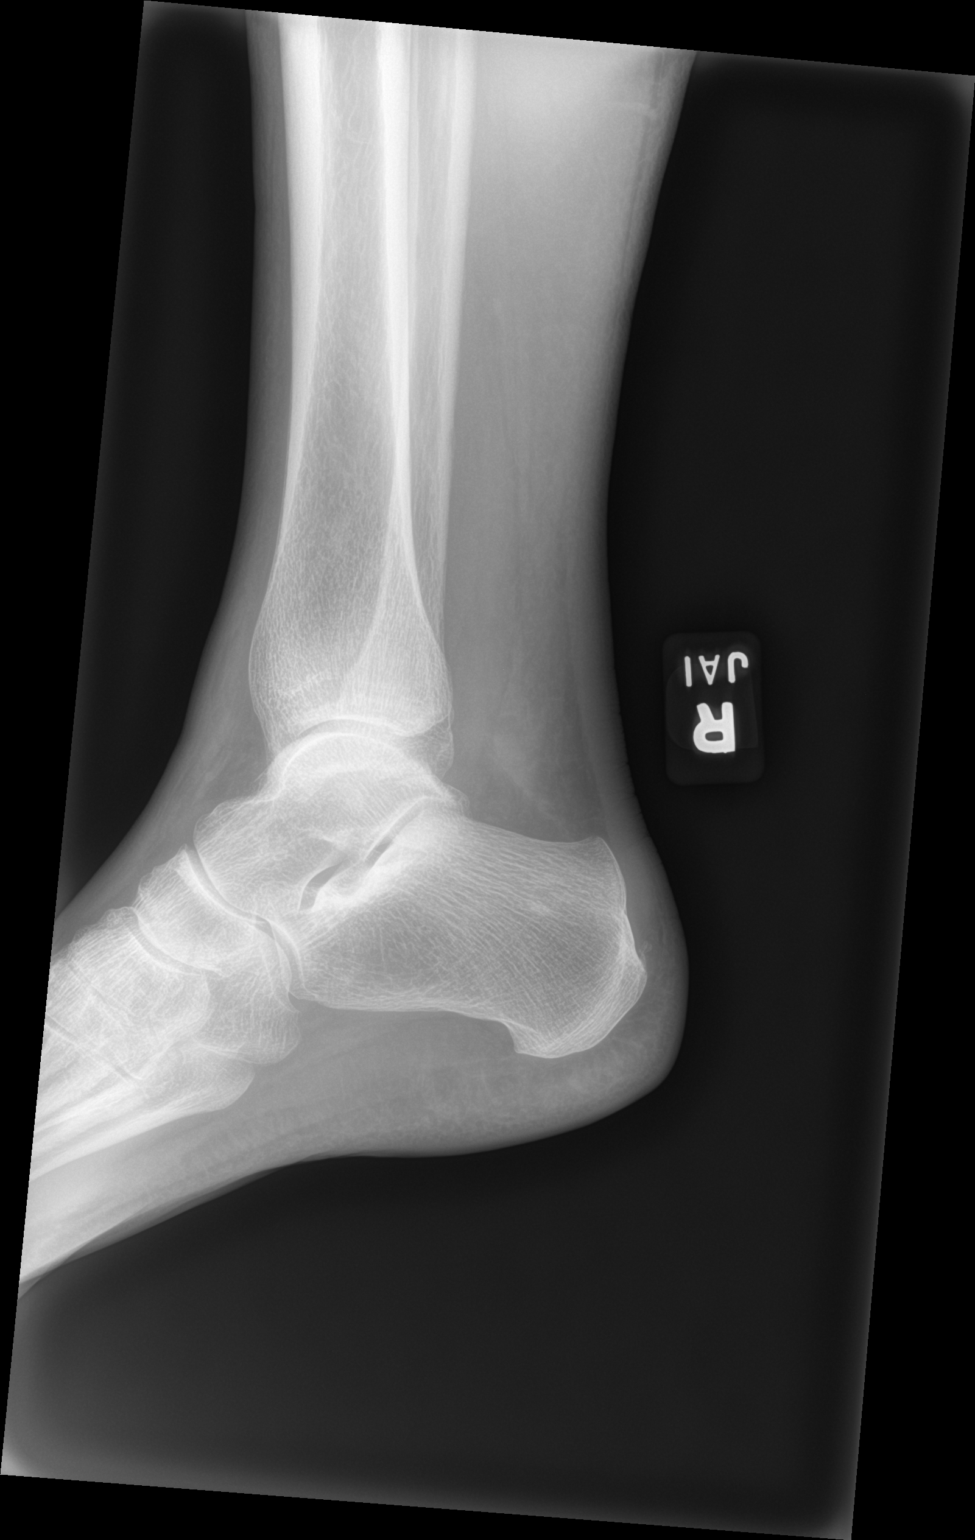

[3 of 3 positions shown; findings below may reference images not displayed]

FINDINGS: Minimally displaced fracture is seen involving the distal fibula
with overlying soft tissue swelling. No other bony abnormality is
noted. Joint spaces are intact.
IMPRESSION: Minimally displaced distal fibular fracture with overlying soft
tissue swelling.

## 2021-04-02 NOTE — Progress Notes (Signed)
Subjective:    Patient ID: Miranda Guerra, female    DOB: 04-12-1961, 60 y.o.   MRN: [id number]  This visit occurred during the SARS-CoV-2 public health emergency.  Safety protocols were in place, including screening questions prior to the visit, additional usage of staff PPE, and extensive cleaning of exam room while observing appropriate contact time as indicated for disinfecting solutions.    HPI The patient is here for an acute visit.   Her sister died suddenly and unexpectedly almost three weeks ago.    She is having a hard time with accepting her death - it was a shock.  She has been experiencing several symptoms  since that night when she found her.  She states headaches, chest pan, palpitations - she finds it difficult to focus at times.   Medications and allergies reviewed with patient and updated if appropriate.  Patient Active Problem List   Diagnosis Date Noted   Diverticulosis of colon 02/18/2021   Family history of colonic polyps 02/18/2021   Dizziness 02/13/2021   Acute right ankle pain 06/12/2020   Anxiety 11/30/2019   Chronic neck pain 06/18/2018   Hyperlipidemia 09/18/2017   Allergic rhinitis 09/10/2017   History of colon cancer 10/01/2012    Current Outpatient Medications on File Prior to Visit  Medication Sig Dispense Refill   ALLEGRA-D ALLERGY & CONGESTION 60-120 MG 12 hr tablet TAKE 1 TABLET BY MOUTH TWICE A DAY 180 tablet 1   ALPRAZolam (XANAX) 0.25 MG tablet Take 1 tablet (0.25 mg total) by mouth 2 (two) times daily as needed for anxiety. 30 tablet 0   Cholecalciferol (VITAMIN D) 50 MCG (2000 UT) CAPS Take by mouth.     fluticasone (FLONASE) 50 MCG/ACT nasal spray SPRAY 2 SPRAYS INTO EACH NOSTRIL EVERY DAY 48 mL 3   No current facility-administered medications on file prior to visit.    Past Medical History:  Diagnosis Date   Allergic rhinitis    Colon polyps    Hyperlipidemia     Past Surgical History:  Procedure Laterality Date    COLONOSCOPY     CYSTECTOMY     fallopian tube removal     OOPHORECTOMY     POLYPECTOMY      Social History   Socioeconomic History   Marital status: Married    Spouse name: Not on file   Number of children: 0   Years of education: Not on file   Highest education level: Not on file  Occupational History   Not on file  Tobacco Use   Smoking status: Former    Types: Cigarettes    Quit date: 07/15/1987    Years since quitting: 33.7   Smokeless tobacco: Never  Substance and Sexual Activity   Alcohol use: Yes    Alcohol/week: 4.0 standard drinks    Types: 4 Glasses of wine per week   Drug use: No   Sexual activity: Never  Other Topics Concern   Not on file  Social History Narrative   Exercises 4/week - gym   Married , no children   Social Determinants of Health   Financial Resource Strain: Not on file  Food Insecurity: Not on file  Transportation Needs: Not on file  Physical Activity: Not on file  Stress: Not on file  Social Connections: Not on file    Family History  Problem Relation Age of Onset   Alzheimer's disease Mother    Hypertension Mother    Transient ischemic attack Mother  Stroke Mother    Heart disease Maternal Grandfather    Emphysema Maternal Grandfather    Hyperlipidemia Maternal Grandfather    Hypertension Maternal Grandfather    Lung cancer Paternal Grandmother    Emphysema Paternal Grandfather    Heart disease Paternal Grandfather    Colon polyps Father    Hyperlipidemia Father    Hearing loss Father    Hypertension Maternal Grandmother    Breast cancer Sister    Breast cancer Maternal Aunt    Breast cancer Sister    Colon cancer Neg Hx     Review of Systems  Constitutional:  Negative for appetite change and fever.  Respiratory:  Negative for shortness of breath.   Cardiovascular:  Positive for chest pain and palpitations. Negative for leg swelling.  Neurological:  Positive for headaches. Negative for dizziness and light-headedness.   Psychiatric/Behavioral:  Negative for sleep disturbance. The patient is nervous/anxious.       Objective:   Vitals:   04/03/21 1125  BP: 132/76  Pulse: 76  Temp: 98.3 F (36.8 C)  SpO2: 97%   BP Readings from Last 3 Encounters:  04/03/21 132/76  06/12/20 132/80  06/04/20 126/70   Wt Readings from Last 3 Encounters:  04/03/21 181 lb (82.1 kg)  06/04/20 178 lb (80.7 kg)  11/30/19 176 lb (79.8 kg)   Body mass index is 28.35 kg/m.   Physical Exam    Constitutional: Appears well-developed and well-nourished. No distress.  Head: Normocephalic and atraumatic.  Neck: Neck supple. No tracheal deviation present. No thyromegaly present.  No cervical lymphadenopathy Cardiovascular: Normal rate, regular rhythm and normal heart sounds.  No murmur heard. No carotid bruit .  No edema Pulmonary/Chest: Effort normal and breath sounds normal. No respiratory distress. No has no wheezes. No rales.  Skin: Skin is warm and dry. Not diaphoretic.  Psychiatric: anxious and depressed mood and affect. Behavior is normal.       Assessment & Plan:    See Problem List for Assessment and Plan of chronic medical problems.

## 2021-04-03 ENCOUNTER — Other Ambulatory Visit: Payer: Self-pay

## 2021-04-03 ENCOUNTER — Ambulatory Visit (INDEPENDENT_AMBULATORY_CARE_PROVIDER_SITE_OTHER): Payer: 59 | Admitting: Internal Medicine

## 2021-04-03 ENCOUNTER — Encounter: Payer: Self-pay | Admitting: Internal Medicine

## 2021-04-03 DIAGNOSIS — F419 Anxiety disorder, unspecified: Secondary | ICD-10-CM | POA: Diagnosis not present

## 2021-04-03 NOTE — Patient Instructions (Signed)
    Use the alprazolam as needed.

## 2021-04-03 NOTE — Assessment & Plan Note (Signed)
Acute on chronic Having increased anxiety and physical symptoms of anxiety since her sister's death Vitals are good, exam is normal and all her symptoms started the night she found her sister dead - discuss this is all anxiety Continue xanax 0.125-0.25 mg twice daily prn Deferred daily SSRI She will look into speaking to a therapist Will return if anxiety is not controlled

## 2021-05-27 ENCOUNTER — Encounter: Payer: Self-pay | Admitting: Internal Medicine

## 2021-05-27 ENCOUNTER — Telehealth: Payer: Self-pay | Admitting: Podiatry

## 2021-05-27 NOTE — Telephone Encounter (Signed)
Pt called wanting me to let you know her ingrown returned. I told her you were booked up next week and I scheduled her for 11/29.

## 2021-05-29 MED ORDER — ALPRAZOLAM 0.25 MG PO TABS: 0.2500 mg | ORAL_TABLET | Freq: Two times a day (BID) | ORAL | 2 refills | Status: DC | PRN

## 2021-06-11 ENCOUNTER — Other Ambulatory Visit: Payer: Self-pay

## 2021-06-11 ENCOUNTER — Ambulatory Visit (INDEPENDENT_AMBULATORY_CARE_PROVIDER_SITE_OTHER): Payer: 59 | Admitting: Podiatry

## 2021-06-11 ENCOUNTER — Encounter: Payer: Self-pay | Admitting: Podiatry

## 2021-06-11 DIAGNOSIS — L600 Ingrowing nail: Secondary | ICD-10-CM | POA: Diagnosis not present

## 2021-06-11 DIAGNOSIS — M79674 Pain in right toe(s): Secondary | ICD-10-CM

## 2021-06-11 MED ORDER — CEPHALEXIN 500 MG PO CAPS: 500.0000 mg | ORAL_CAPSULE | Freq: Three times a day (TID) | ORAL | 0 refills | Status: DC

## 2021-06-13 ENCOUNTER — Telehealth: Payer: Self-pay | Admitting: Internal Medicine

## 2021-06-13 ENCOUNTER — Telehealth: Payer: Self-pay | Admitting: *Deleted

## 2021-06-13 ENCOUNTER — Encounter: Payer: Self-pay | Admitting: Internal Medicine

## 2021-06-13 DIAGNOSIS — Z0000 Encounter for general adult medical examination without abnormal findings: Secondary | ICD-10-CM

## 2021-06-13 DIAGNOSIS — E782 Mixed hyperlipidemia: Secondary | ICD-10-CM

## 2021-06-13 DIAGNOSIS — R739 Hyperglycemia, unspecified: Secondary | ICD-10-CM

## 2021-06-13 NOTE — Telephone Encounter (Signed)
Patient is calling because something was supposed to be given to go over the toe but did not receive. Please advise.

## 2021-06-13 NOTE — Telephone Encounter (Signed)
Patient requesting order for labs prior to cpe on 07-03-2021

## 2021-06-13 NOTE — Progress Notes (Signed)
Subjective: 60 year old female presents the office today for evaluation of ingrown toenail right big toe, lateral aspect.  She states that she was soaking it and doing everything as told but the area is still tender.  She denies any drainage.  No red streaks but does get swelling around the nail border.   Objective: AAO x3, NAD DP/PT pulses palpable bilaterally, CRT less than 3 seconds Localized edema and erythema to the lateral aspect of the right nail fold.  There is no drainage or pus.  Small scab is present along the nail is able debride today.  Upon weightbearing slight valgus deformity of the toe noted which could be causing the pressure to the toenail. No open lesions or pre-ulcerative lesions.  No pain with calf compression, swelling, warmth, erythema  Assessment: Recurrent ingrown toenail right lateral nail border  Plan: -All treatment options discussed with the patient including all alternatives, risks, complications.  -She does not want to proceed with a partial nail avulsion today.  Continue Epson salt soaks and antibiotic ointment.  Prescribed cephalexin.  Toe cap for protection. -Patient encouraged to call the office with any questions, concerns, change in symptoms.   Trula Slade DPM

## 2021-06-13 NOTE — Telephone Encounter (Signed)
Blood work ordered.

## 2021-06-14 NOTE — Telephone Encounter (Signed)
Returned the call to patient and gave her information per Dr Leveda Anna that she will like the toe caps mailed to her address. m

## 2021-06-17 NOTE — Telephone Encounter (Signed)
Appointment made

## 2021-06-24 NOTE — Telephone Encounter (Signed)
Mailed the toe caps to patient per her request.

## 2021-07-01 LAB — HM COLONOSCOPY

## 2021-07-02 ENCOUNTER — Encounter: Payer: Self-pay | Admitting: Internal Medicine

## 2021-07-02 ENCOUNTER — Other Ambulatory Visit (INDEPENDENT_AMBULATORY_CARE_PROVIDER_SITE_OTHER): Payer: 59

## 2021-07-02 ENCOUNTER — Other Ambulatory Visit: Payer: Self-pay

## 2021-07-02 DIAGNOSIS — Z0000 Encounter for general adult medical examination without abnormal findings: Secondary | ICD-10-CM

## 2021-07-02 DIAGNOSIS — E782 Mixed hyperlipidemia: Secondary | ICD-10-CM

## 2021-07-02 DIAGNOSIS — R739 Hyperglycemia, unspecified: Secondary | ICD-10-CM | POA: Diagnosis not present

## 2021-07-02 LAB — COMPREHENSIVE METABOLIC PANEL
ALT: 30 U/L (ref 0–35)
AST: 22 U/L (ref 0–37)
Albumin: 4.2 g/dL (ref 3.5–5.2)
Alkaline Phosphatase: 62 U/L (ref 39–117)
BUN: 15 mg/dL (ref 6–23)
CO2: 31 mEq/L (ref 19–32)
Calcium: 9.4 mg/dL (ref 8.4–10.5)
Chloride: 103 mEq/L (ref 96–112)
Creatinine, Ser: 0.78 mg/dL (ref 0.40–1.20)
GFR: 82.04 mL/min (ref >60.00)
Glucose, Bld: 89 mg/dL (ref 70–99)
Potassium: 4.3 mEq/L (ref 3.5–5.1)
Sodium: 142 mEq/L (ref 135–145)
Total Bilirubin: 0.7 mg/dL (ref 0.2–1.2)
Total Protein: 6.9 g/dL (ref 6.0–8.3)

## 2021-07-02 LAB — LIPID PANEL
Cholesterol: 299 mg/dL — ABNORMAL HIGH (ref 0–200)
HDL: 63.4 mg/dL (ref >39.00)
LDL Cholesterol: 202 mg/dL — ABNORMAL HIGH (ref 0–99)
NonHDL: 235.23
Total CHOL/HDL Ratio: 5
Triglycerides: 168 mg/dL — ABNORMAL HIGH (ref 0.0–149.0)
VLDL: 33.6 mg/dL (ref 0.0–40.0)

## 2021-07-02 LAB — CBC WITH DIFFERENTIAL/PLATELET
Basophils Absolute: 0.1 10*3/uL (ref 0.0–0.1)
Basophils Relative: 1 % (ref 0.0–3.0)
Eosinophils Absolute: 0.2 10*3/uL (ref 0.0–0.7)
Eosinophils Relative: 3.5 % (ref 0.0–5.0)
HCT: 39.9 % (ref 36.0–46.0)
Hemoglobin: 13.2 g/dL (ref 12.0–15.0)
Lymphocytes Relative: 26.7 % (ref 12.0–46.0)
Lymphs Abs: 1.4 10*3/uL (ref 0.7–4.0)
MCHC: 33.1 g/dL (ref 30.0–36.0)
MCV: 89.8 fl (ref 78.0–100.0)
Monocytes Absolute: 0.5 10*3/uL (ref 0.1–1.0)
Monocytes Relative: 9 % (ref 3.0–12.0)
Neutro Abs: 3.1 10*3/uL (ref 1.4–7.7)
Neutrophils Relative %: 59.8 % (ref 43.0–77.0)
Platelets: 219 10*3/uL (ref 150.0–400.0)
RBC: 4.44 Mil/uL (ref 3.87–5.11)
RDW: 13.4 % (ref 11.5–15.5)
WBC: 5.2 10*3/uL (ref 4.0–10.5)

## 2021-07-02 LAB — HEMOGLOBIN A1C: Hgb A1c MFr Bld: 5.6 % (ref 4.6–6.5)

## 2021-07-02 LAB — TSH: TSH: 1.91 u[IU]/mL (ref 0.35–5.50)

## 2021-07-02 NOTE — Patient Instructions (Addendum)
Medications changes include :  crestor 5 mg daily for cholesterol.   Your prescription(s) have been submitted to your pharmacy. Please take as directed and contact our office if you believe you are having problem(s) with the medication(s).   A Ct scan of your heart was ordered.   Someone will call you to schedule an appointment.    Please followup in 6 months    Health Maintenance, Female Adopting a healthy lifestyle and getting preventive care are important in promoting health and wellness. Ask your health care provider about: The right schedule for you to have regular tests and exams. Things you can do on your own to prevent diseases and keep yourself healthy. What should I know about diet, weight, and exercise? Eat a healthy diet  Eat a diet that includes plenty of vegetables, fruits, low-fat dairy products, and lean protein. Do not eat a lot of foods that are high in solid fats, added sugars, or sodium. Maintain a healthy weight Body mass index (BMI) is used to identify weight problems. It estimates body fat based on height and weight. Your health care provider can help determine your BMI and help you achieve or maintain a healthy weight. Get regular exercise Get regular exercise. This is one of the most important things you can do for your health. Most adults should: Exercise for at least 150 minutes each week. The exercise should increase your heart rate and make you sweat (moderate-intensity exercise). Do strengthening exercises at least twice a week. This is in addition to the moderate-intensity exercise. Spend less time sitting. Even light physical activity can be beneficial. Watch cholesterol and blood lipids Have your blood tested for lipids and cholesterol at 61 years of age, then have this test every 5 years. Have your cholesterol levels checked more often if: Your lipid or cholesterol levels are high. You are older than 61 years of age. You are at high risk for  heart disease. What should I know about cancer screening? Depending on your health history and family history, you may need to have cancer screening at various ages. This may include screening for: Breast cancer. Cervical cancer. Colorectal cancer. Skin cancer. Lung cancer. What should I know about heart disease, diabetes, and high blood pressure? Blood pressure and heart disease High blood pressure causes heart disease and increases the risk of stroke. This is more likely to develop in people who have high blood pressure readings or are overweight. Have your blood pressure checked: Every 3-5 years if you are 41-69 years of age. Every year if you are 38 years old or older. Diabetes Have regular diabetes screenings. This checks your fasting blood sugar level. Have the screening done: Once every three years after age 49 if you are at a normal weight and have a low risk for diabetes. More often and at a younger age if you are overweight or have a high risk for diabetes. What should I know about preventing infection? Hepatitis B If you have a higher risk for hepatitis B, you should be screened for this virus. Talk with your health care provider to find out if you are at risk for hepatitis B infection. Hepatitis C Testing is recommended for: Everyone born from [birth year] through 1966. Anyone with known risk factors for hepatitis C. Sexually transmitted infections (STIs) Get screened for STIs, including gonorrhea and chlamydia, if: You are sexually active and are younger than 61 years of age. You are older than 61 years of age and  your health care provider tells you that you are at risk for this type of infection. Your sexual activity has changed since you were last screened, and you are at increased risk for chlamydia or gonorrhea. Ask your health care provider if you are at risk. Ask your health care provider about whether you are at high risk for HIV. Your health care provider may recommend a  prescription medicine to help prevent HIV infection. If you choose to take medicine to prevent HIV, you should first get tested for HIV. You should then be tested every 3 months for as long as you are taking the medicine. Pregnancy If you are about to stop having your period (premenopausal) and you may become pregnant, seek counseling before you get pregnant. Take 400 to 800 micrograms (mcg) of folic acid every day if you become pregnant. Ask for birth control (contraception) if you want to prevent pregnancy. Osteoporosis and menopause Osteoporosis is a disease in which the bones lose minerals and strength with aging. This can result in bone fractures. If you are 42 years old or older, or if you are at risk for osteoporosis and fractures, ask your health care provider if you should: Be screened for bone loss. Take a calcium or vitamin D supplement to lower your risk of fractures. Be given hormone replacement therapy (HRT) to treat symptoms of menopause. Follow these instructions at home: Alcohol use Do not drink alcohol if: Your health care provider tells you not to drink. You are pregnant, may be pregnant, or are planning to become pregnant. If you drink alcohol: Limit how much you have to: 0-1 drink a day. Know how much alcohol is in your drink. In the [place] one drink equals one 12 oz bottle of beer (355 mL), one 5 oz glass of wine (148 mL), or one 1 oz glass of hard liquor (44 mL). Lifestyle Do not use any products that contain nicotine or tobacco. These products include cigarettes, chewing tobacco, and vaping devices, such as e-cigarettes. If you need help quitting, ask your health care provider. Do not use street drugs. Do not share needles. Ask your health care provider for help if you need support or information about quitting drugs. General instructions Schedule regular health, dental, and eye exams. Stay current with your vaccines. Tell your health care provider if: You often  feel depressed. You have ever been abused or do not feel safe at home. Summary Adopting a healthy lifestyle and getting preventive care are important in promoting health and wellness. Follow your health care provider's instructions about healthy diet, exercising, and getting tested or screened for diseases. Follow your health care provider's instructions on monitoring your cholesterol and blood pressure. This information is not intended to replace advice given to you by your health care provider. Make sure you discuss any questions you have with your health care provider. Document Revised: 11/19/2020 Document Reviewed: 11/19/2020 Elsevier Patient Education  [place].

## 2021-07-02 NOTE — Progress Notes (Signed)
Subjective:    Patient ID: Miranda Guerra, female    DOB: 08/05/1960, 60 y.o.   MRN: [id number]   This visit occurred during the SARS-CoV-2 public health emergency.  Safety protocols were in place, including screening questions prior to the visit, additional usage of staff PPE, and extensive cleaning of exam room while observing appropriate contact time as indicated for disinfecting solutions.    HPI She is here for a physical exam.   Just had colonoscopy on Monday. Dr Collene Mares.    Sees gyn in January.   Medications and allergies reviewed with patient and updated if appropriate.  Patient Active Problem List   Diagnosis Date Noted   Diverticulosis of colon 02/18/2021   Family history of colonic polyps 02/18/2021   Dizziness 02/13/2021   Acute right ankle pain 06/12/2020   Anxiety 11/30/2019   Chronic neck pain 06/18/2018   Hyperlipidemia 09/18/2017   Allergic rhinitis 09/10/2017   History of colon cancer 10/01/2012    Current Outpatient Medications on File Prior to Visit  Medication Sig Dispense Refill   ALLEGRA-D ALLERGY & CONGESTION 60-120 MG 12 hr tablet TAKE 1 TABLET BY MOUTH TWICE A DAY 180 tablet 1   ALPRAZolam (XANAX) 0.25 MG tablet Take 1 tablet (0.25 mg total) by mouth 2 (two) times daily as needed for anxiety. 30 tablet 2   Cholecalciferol (VITAMIN D) 50 MCG (2000 UT) CAPS Take by mouth.     CLENPIQ 10-3.5-12 MG-GM -GM/160ML SOLN Take by mouth.     fluticasone (FLONASE) 50 MCG/ACT nasal spray SPRAY 2 SPRAYS INTO EACH NOSTRIL EVERY DAY 48 mL 3   No current facility-administered medications on file prior to visit.    Past Medical History:  Diagnosis Date   Allergic rhinitis    Colon polyps    Hyperlipidemia     Past Surgical History:  Procedure Laterality Date   COLONOSCOPY     CYSTECTOMY     fallopian tube removal     OOPHORECTOMY     POLYPECTOMY      Social History   Socioeconomic History   Marital status: Married    Spouse name: Not on file    Number of children: 0   Years of education: Not on file   Highest education level: Not on file  Occupational History   Not on file  Tobacco Use   Smoking status: Former    Types: Cigarettes    Quit date: 07/15/1987    Years since quitting: 33.9   Smokeless tobacco: Never  Substance and Sexual Activity   Alcohol use: Yes    Alcohol/week: 4.0 standard drinks    Types: 4 Glasses of wine per week   Drug use: No   Sexual activity: Never  Other Topics Concern   Not on file  Social History Narrative   Exercises 4/week - gym   Married , no children   Social Determinants of Health   Financial Resource Strain: Not on file  Food Insecurity: Not on file  Transportation Needs: Not on file  Physical Activity: Not on file  Stress: Not on file  Social Connections: Not on file    Family History  Problem Relation Age of Onset   Alzheimer's disease Mother    Hypertension Mother    Transient ischemic attack Mother    Stroke Mother    Heart disease Maternal Grandfather    Emphysema Maternal Grandfather    Hyperlipidemia Maternal Grandfather    Hypertension Maternal Grandfather    Lung  cancer Paternal Grandmother    Emphysema Paternal Grandfather    Heart disease Paternal Grandfather    Colon polyps Father    Hyperlipidemia Father    Hearing loss Father    Hypertension Maternal Grandmother    Breast cancer Sister    Breast cancer Maternal Aunt    Breast cancer Sister    Colon cancer Neg Hx     Review of Systems  Constitutional:  Negative for chills and fever.  Eyes:  Negative for visual disturbance.  Respiratory:  Positive for cough (residual from URI). Negative for shortness of breath and wheezing.   Cardiovascular:  Negative for chest pain, palpitations and leg swelling.  Gastrointestinal:  Negative for abdominal pain, blood in stool, constipation, diarrhea and nausea.       No gerd  Genitourinary:  Negative for dysuria and hematuria.  Musculoskeletal:  Negative for  arthralgias.  Skin:  Negative for rash.  Neurological:  Positive for dizziness (with quick head movements). Negative for headaches.  Psychiatric/Behavioral:  Negative for dysphoric mood. The patient is nervous/anxious.       Objective:   Vitals:   07/03/21 1348  BP: 118/70  Pulse: 77  Temp: 98.2 F (36.8 C)  SpO2: 97%   There were no vitals filed for this visit. Body mass index is 28.35 kg/m.  BP Readings from Last 3 Encounters:  07/03/21 118/70  04/03/21 132/76  06/12/20 132/80    Wt Readings from Last 3 Encounters:  04/03/21 181 lb (82.1 kg)  06/04/20 178 lb (80.7 kg)  11/30/19 176 lb (79.8 kg)    Depression screen [facility] 2/9 11/30/2019 04/02/2018  Decreased Interest 1 0  Down, Depressed, Hopeless 0 0  PHQ - 2 Score 1 0  Altered sleeping 0 0  Tired, decreased energy 1 0  Change in appetite 0 0  Feeling bad or failure about yourself  0 0  Trouble concentrating 0 0  Moving slowly or fidgety/restless 0 0  Suicidal thoughts 0 0  PHQ-9 Score 2 0  Difficult doing work/chores Somewhat difficult -     GAD 7 : Generalized Anxiety Score 11/30/2019  Nervous, Anxious, on Edge 1  Control/stop worrying 1  Worry too much - different things 1  Trouble relaxing 0  Restless 0  Easily annoyed or irritable 1  Afraid - awful might happen 1  Total GAD 7 Score 5  Anxiety Difficulty Somewhat difficult       Physical Exam Constitutional: She appears well-developed and well-nourished. No distress.  HENT:  Head: Normocephalic and atraumatic.  Right Ear: External ear normal. Normal ear canal and TM Left Ear: External ear normal.  Normal ear canal and TM Mouth/Throat: Oropharynx is clear and moist.  Eyes: Conjunctivae and EOM are normal.  Neck: Neck supple. No tracheal deviation present. No thyromegaly present.  No carotid bruit  Cardiovascular: Normal rate, regular rhythm and normal heart sounds.   No murmur heard.  No edema. Pulmonary/Chest: Effort normal and breath sounds  normal. No respiratory distress. She has no wheezes. She has no rales.  Breast: deferred   Abdominal: Soft. She exhibits no distension. There is no tenderness.  Lymphadenopathy: She has no cervical adenopathy.  Skin: Skin is warm and dry. She is not diaphoretic.  Psychiatric: She has a normal mood and affect. Her behavior is normal.     Lab Results  Component Value Date   WBC 5.2 07/02/2021   HGB 13.2 07/02/2021   HCT 39.9 07/02/2021   PLT 219.0 07/02/2021  GLUCOSE 89 07/02/2021   CHOL 299 (H) 07/02/2021   TRIG 168.0 (H) 07/02/2021   HDL 63.40 07/02/2021   LDLCALC 202 (H) 07/02/2021   ALT 30 07/02/2021   AST 22 07/02/2021   NA 142 07/02/2021   K 4.3 07/02/2021   CL 103 07/02/2021   CREATININE 0.78 07/02/2021   BUN 15 07/02/2021   CO2 31 07/02/2021   TSH 1.91 07/02/2021   HGBA1C 5.6 07/02/2021         Assessment & Plan:   Physical exam: Screening blood work  reviewed Exercise  getting more regular Weight  will work on weight loss Substance abuse  none   Reviewed recommended immunizations.   Health Maintenance  Topic Date Due   Pneumococcal Vaccine 56-66 Years old (1 - PCV) Never done   Zoster Vaccines- Shingrix (1 of 2) Never done   COLONOSCOPY (Pts 45-31yrs Insurance coverage will need to be confirmed)  05/10/2018   MAMMOGRAM  07/09/2019   COVID-19 Vaccine (4 - Booster for Pfizer series) 07/17/2020   PAP SMEAR-Modifier  07/13/2022   TETANUS/TDAP  07/14/2022   INFLUENZA VACCINE  Completed   HPV VACCINES  Aged Out   Hepatitis C Screening  Discontinued   HIV Screening  Discontinued          See Problem List for Assessment and Plan of chronic medical problems.

## 2021-07-03 ENCOUNTER — Ambulatory Visit (INDEPENDENT_AMBULATORY_CARE_PROVIDER_SITE_OTHER): Payer: 59 | Admitting: Internal Medicine

## 2021-07-03 ENCOUNTER — Other Ambulatory Visit: Payer: Self-pay

## 2021-07-03 VITALS — BP 118/70 | HR 77 | Temp 98.2°F | Ht 67.0 in

## 2021-07-03 DIAGNOSIS — Z85038 Personal history of other malignant neoplasm of large intestine: Secondary | ICD-10-CM

## 2021-07-03 DIAGNOSIS — Z136 Encounter for screening for cardiovascular disorders: Secondary | ICD-10-CM

## 2021-07-03 DIAGNOSIS — F419 Anxiety disorder, unspecified: Secondary | ICD-10-CM | POA: Diagnosis not present

## 2021-07-03 DIAGNOSIS — Z0000 Encounter for general adult medical examination without abnormal findings: Secondary | ICD-10-CM

## 2021-07-03 DIAGNOSIS — E782 Mixed hyperlipidemia: Secondary | ICD-10-CM | POA: Diagnosis not present

## 2021-07-03 MED ORDER — ROSUVASTATIN CALCIUM 5 MG PO TABS: 5.0000 mg | ORAL_TABLET | Freq: Every day | ORAL | 5 refills | Status: DC

## 2021-07-04 DIAGNOSIS — Z136 Encounter for screening for cardiovascular disorders: Secondary | ICD-10-CM | POA: Insufficient documentation

## 2021-07-04 NOTE — Assessment & Plan Note (Signed)
H/o stage 1 colon cancer Just had colonoscopy a few days ago - clear

## 2021-07-04 NOTE — Assessment & Plan Note (Signed)
Chronic Mild GAD, more recent increased anxiety due to sister's death - related to grieving Seeing a therapist Continue alprazolam 0.25 mg daily prn Continue regular exercise Seeing a therapist

## 2021-07-04 NOTE — Assessment & Plan Note (Signed)
Chronic Check lipid panel  Currently diet controlled - advised to start statin which she will think about and maybe start after coronary calcium score ct - crestor 5mg  daily  Regular exercise and healthy diet encouraged

## 2021-07-04 NOTE — Assessment & Plan Note (Signed)
High cholesterol, sister died of probable MI recently Would benefit from coronary calcium score to evaluate risk Advised starting statin - crestor 5 mg daily further evaluation/ treatment after Ct scan

## 2021-07-16 ENCOUNTER — Encounter: Payer: Self-pay | Admitting: Internal Medicine

## 2021-07-27 ENCOUNTER — Other Ambulatory Visit: Payer: Self-pay | Admitting: Internal Medicine

## 2021-08-01 ENCOUNTER — Encounter: Payer: Self-pay | Admitting: Internal Medicine

## 2021-09-09 ENCOUNTER — Ambulatory Visit (INDEPENDENT_AMBULATORY_CARE_PROVIDER_SITE_OTHER)
Admission: RE | Admit: 2021-09-09 | Discharge: 2021-09-09 | Disposition: A | Discharge status: Home / Self Care | Payer: Self-pay | Source: Ambulatory Visit | Attending: Internal Medicine | Admitting: Internal Medicine

## 2021-09-09 ENCOUNTER — Encounter: Payer: Self-pay | Admitting: Internal Medicine

## 2021-09-09 ENCOUNTER — Other Ambulatory Visit: Payer: Self-pay

## 2021-09-09 DIAGNOSIS — R931 Abnormal findings on diagnostic imaging of heart and coronary circulation: Secondary | ICD-10-CM | POA: Insufficient documentation

## 2021-09-09 DIAGNOSIS — Z136 Encounter for screening for cardiovascular disorders: Secondary | ICD-10-CM

## 2021-09-10 ENCOUNTER — Encounter: Payer: Self-pay | Admitting: Internal Medicine

## 2021-10-30 ENCOUNTER — Other Ambulatory Visit: Payer: Self-pay | Admitting: Internal Medicine

## 2021-12-25 ENCOUNTER — Encounter: Payer: Self-pay | Admitting: Internal Medicine

## 2021-12-25 NOTE — Progress Notes (Signed)
Subjective:    Patient ID: Miranda Guerra, female    DOB: Jan 04, 1961, 61 y.o.   MRN: [id number]     HPI Miranda Guerra is here for follow up of her chronic medical problems, including hld, anxiety  She is exercising regularly.    Gym three times a week.  Walking regularly with dog.    Overall doing better.  Medications and allergies reviewed with patient and updated if appropriate.  Current Outpatient Medications on File Prior to Visit  Medication Sig Dispense Refill   ALLEGRA-D ALLERGY & CONGESTION 60-120 MG 12 hr tablet TAKE 1 TABLET BY MOUTH TWICE A DAY 180 tablet 1   ALPRAZolam (XANAX) 0.25 MG tablet Take 1 tablet (0.25 mg total) by mouth 2 (two) times daily as needed for anxiety. 30 tablet 2   Cholecalciferol (VITAMIN D) 50 MCG (2000 UT) CAPS Take by mouth.     CLENPIQ 10-3.5-12 MG-GM -GM/160ML SOLN Take by mouth.     fluticasone (FLONASE) 50 MCG/ACT nasal spray SPRAY 2 SPRAYS INTO EACH NOSTRIL EVERY DAY 48 mL 3   rosuvastatin (CRESTOR) 5 MG tablet TAKE 1 TABLET (5 MG TOTAL) BY MOUTH DAILY. 90 tablet 1   No current facility-administered medications on file prior to visit.     Review of Systems  Constitutional:  Negative for chills and fever.  Respiratory:  Negative for cough, shortness of breath and wheezing.   Cardiovascular:  Negative for chest pain, palpitations and leg swelling.  Neurological:  Negative for light-headedness and headaches.       Objective:   Vitals:   12/26/21 1044  BP: 124/74  Pulse: 78  Temp: 98.1 F (36.7 C)  SpO2: 98%   BP Readings from Last 3 Encounters:  12/26/21 124/74  07/03/21 118/70  04/03/21 132/76   Wt Readings from Last 3 Encounters:  12/26/21 186 lb 3.2 oz (84.5 kg)  04/03/21 181 lb (82.1 kg)  06/04/20 178 lb (80.7 kg)   Body mass index is 29.16 kg/m.    Physical Exam Constitutional:      General: She is not in acute distress.    Appearance: Normal appearance.  HENT:     Head: Normocephalic and atraumatic.   Eyes:     Conjunctiva/sclera: Conjunctivae normal.  Cardiovascular:     Rate and Rhythm: Normal rate and regular rhythm.     Heart sounds: Normal heart sounds. No murmur heard. Pulmonary:     Effort: Pulmonary effort is normal. No respiratory distress.     Breath sounds: Normal breath sounds. No wheezing.  Musculoskeletal:     Right lower leg: No edema.     Left lower leg: No edema.  Skin:    General: Skin is warm and dry.     Findings: No rash.  Neurological:     Mental Status: She is alert. Mental status is at baseline.  Psychiatric:        Mood and Affect: Mood normal.        Behavior: Behavior normal.        Lab Results  Component Value Date   WBC 5.2 07/02/2021   HGB 13.2 07/02/2021   HCT 39.9 07/02/2021   PLT 219.0 07/02/2021   GLUCOSE 89 07/02/2021   CHOL 299 (H) 07/02/2021   TRIG 168.0 (H) 07/02/2021   HDL 63.40 07/02/2021   LDLCALC 202 (H) 07/02/2021   ALT 30 07/02/2021   AST 22 07/02/2021   NA 142 07/02/2021   K 4.3 07/02/2021  CL 103 07/02/2021   CREATININE 0.78 07/02/2021   BUN 15 07/02/2021   CO2 31 07/02/2021   TSH 1.91 07/02/2021   HGBA1C 5.6 07/02/2021     Assessment & Plan:    See Problem List for Assessment and Plan of chronic medical problems.

## 2021-12-26 ENCOUNTER — Ambulatory Visit (INDEPENDENT_AMBULATORY_CARE_PROVIDER_SITE_OTHER): Payer: 59 | Admitting: Internal Medicine

## 2021-12-26 VITALS — BP 124/74 | HR 78 | Temp 98.1°F | Ht 67.0 in | Wt 186.2 lb

## 2021-12-26 DIAGNOSIS — E782 Mixed hyperlipidemia: Secondary | ICD-10-CM

## 2021-12-26 DIAGNOSIS — F419 Anxiety disorder, unspecified: Secondary | ICD-10-CM

## 2021-12-26 NOTE — Assessment & Plan Note (Addendum)
Chronic Regular exercise and healthy diet encouraged Check lipid panel, CMP Continue Crestor 5 mg daily

## 2021-12-26 NOTE — Assessment & Plan Note (Addendum)
Chronic Improved Controlled, Stable Continue alprazolam 0.25 mg twice daily as needed

## 2021-12-26 NOTE — Patient Instructions (Addendum)
     Blood work was ordered.     Medications changes include :   none     Return in about 6 months (around 06/27/2022) for Physical Exam.

## 2022-03-19 ENCOUNTER — Telehealth: Payer: Self-pay

## 2022-03-19 NOTE — Telephone Encounter (Signed)
ATC pt to see of she has had a recent Mammogram or if she is needing help sched one.

## 2022-04-21 ENCOUNTER — Ambulatory Visit (INDEPENDENT_AMBULATORY_CARE_PROVIDER_SITE_OTHER): Payer: 59

## 2022-04-21 ENCOUNTER — Ambulatory Visit (INDEPENDENT_AMBULATORY_CARE_PROVIDER_SITE_OTHER): Payer: 59 | Admitting: Podiatry

## 2022-04-21 DIAGNOSIS — L600 Ingrowing nail: Secondary | ICD-10-CM

## 2022-04-21 DIAGNOSIS — M79674 Pain in right toe(s): Secondary | ICD-10-CM

## 2022-04-21 MED ORDER — CEPHALEXIN 500 MG PO CAPS: 500.0000 mg | ORAL_CAPSULE | Freq: Three times a day (TID) | ORAL | 0 refills | Status: DC

## 2022-04-21 NOTE — Progress Notes (Signed)
Subjective: Chief Complaint  Patient presents with   Ingrown Toenail    Right hallux ingrown, having some pain, little drainage,  Had ingrown removal on the same toe 37Xs   61 year old female presents the above concerns.  She states that she has again a reoccurrence of ingrown toenail right big toe, lateral aspect.  I have removed this previously and she also follow-up with orthopedics and have this removed and is growing back and again causing discomfort and she has noticed some swelling or redness.  Objective: AAO x3, NAD DP/PT pulses palpable bilaterally, CRT less than 3 seconds Incurvation present to right lateral hallux nail border localized edema and erythema.  Small amount of clear drainage expressed but there is no purulence.  No ascending cellulitis.  Tenderness to the nail border.  No pain with calf compression, swelling, warmth, erythema  Assessment: Right hallux lateral border ingrown toenail  Plan: -All treatment options discussed with the patient including all alternatives, risks, complications.  -We discussed different treatment options including surgical intervention versus conservative care.  We discussed different types of surgical interventions for ingrown toenails.  We will attempt 1 more time with partial nail avulsion with chemical matricectomy.  However prior to this morning going to start antibiotics I prescribed Keflex and recommended soaking.  We will see her back next week for the procedure.  Hopefully this will get better outcome the phenol will take better the inflammation, the infection improves. -Patient encouraged to call the office with any questions, concerns, change in symptoms.   Trula Slade DPM

## 2022-04-21 NOTE — Patient Instructions (Signed)
Soak Instructions    THE DAY AFTER THE PROCEDURE  Place 1/4 cup of epsom salts in a quart of warm tap water.  Submerge your foot or feet with outer bandage intact for the initial soak; this will allow the bandage to become moist and wet for easy lift off.  Once you remove your bandage, continue to soak in the solution for 20 minutes.  This soak should be done twice a day.  Next, remove your foot or feet from solution, blot dry the affected area and cover.  You may use a band aid large enough to cover the area or use gauze and tape.  Apply other medications to the area as directed by the doctor such as polysporin neosporin.  IF YOUR SKIN BECOMES IRRITATED WHILE USING THESE INSTRUCTIONS, IT IS OKAY TO SWITCH TO  WHITE VINEGAR AND WATER. Or you may use antibacterial soap and water to keep the toe clean  Monitor for any signs/symptoms of infection. Call the office immediately if any occur or go directly to the emergency room. Call with any questions/concerns.  Plantar Fasciitis (Heel Spur Syndrome) with Rehab The plantar fascia is a fibrous, ligament-like, soft-tissue structure that spans the bottom of the foot. Plantar fasciitis is a condition that causes pain in the foot due to inflammation of the tissue. SYMPTOMS  Pain and tenderness on the underneath side of the foot. Pain that worsens with standing or walking. CAUSES  Plantar fasciitis is caused by irritation and injury to the plantar fascia on the underneath side of the foot. Common mechanisms of injury include: Direct trauma to bottom of the foot. Damage to a small nerve that runs under the foot where the main fascia attaches to the heel bone. Stress placed on the plantar fascia due to bone spurs. RISK INCREASES WITH:  Activities that place stress on the plantar fascia (running, jumping, pivoting, or cutting). Poor strength and flexibility. Improperly fitted shoes. Tight calf muscles. Flat feet. Failure to warm-up properly before  activity. Obesity. PREVENTION Warm up and stretch properly before activity. Allow for adequate recovery between workouts. Maintain physical fitness: Strength, flexibility, and endurance. Cardiovascular fitness. Maintain a health body weight. Avoid stress on the plantar fascia. Wear properly fitted shoes, including arch supports for individuals who have flat feet.  PROGNOSIS  If treated properly, then the symptoms of plantar fasciitis usually resolve without surgery. However, occasionally surgery is necessary.  RELATED COMPLICATIONS  Recurrent symptoms that may result in a chronic condition. Problems of the lower back that are caused by compensating for the injury, such as limping. Pain or weakness of the foot during push-off following surgery. Chronic inflammation, scarring, and partial or complete fascia tear, occurring more often from repeated injections.  TREATMENT  Treatment initially involves the use of ice and medication to help reduce pain and inflammation. The use of strengthening and stretching exercises may help reduce pain with activity, especially stretches of the Achilles tendon. These exercises may be performed at home or with a therapist. Your caregiver may recommend that you use heel cups of arch supports to help reduce stress on the plantar fascia. Occasionally, corticosteroid injections are given to reduce inflammation. If symptoms persist for greater than 6 months despite non-surgical (conservative), then surgery may be recommended.   MEDICATION  If pain medication is necessary, then nonsteroidal anti-inflammatory medications, such as aspirin and ibuprofen, or other minor pain relievers, such as acetaminophen, are often recommended. Do not take pain medication within 7 days before surgery. Prescription pain relievers  may be given if deemed necessary by your caregiver. Use only as directed and only as much as you need. Corticosteroid injections may be given by your  caregiver. These injections should be reserved for the most serious cases, because they may only be given a certain number of times.  HEAT AND COLD Cold treatment (icing) relieves pain and reduces inflammation. Cold treatment should be applied for 10 to 15 minutes every 2 to 3 hours for inflammation and pain and immediately after any activity that aggravates your symptoms. Use ice packs or massage the area with a piece of ice (ice massage). Heat treatment may be used prior to performing the stretching and strengthening activities prescribed by your caregiver, physical therapist, or athletic trainer. Use a heat pack or soak the injury in warm water.  SEEK IMMEDIATE MEDICAL CARE IF: Treatment seems to offer no benefit, or the condition worsens. Any medications produce adverse side effects.  EXERCISES- RANGE OF MOTION (ROM) AND STRETCHING EXERCISES - Plantar Fasciitis (Heel Spur Syndrome) These exercises may help you when beginning to rehabilitate your injury. Your symptoms may resolve with or without further involvement from your physician, physical therapist or athletic trainer. While completing these exercises, remember:  Restoring tissue flexibility helps normal motion to return to the joints. This allows healthier, less painful movement and activity. An effective stretch should be held for at least 30 seconds. A stretch should never be painful. You should only feel a gentle lengthening or release in the stretched tissue.  RANGE OF MOTION - Toe Extension, Flexion Sit with your right / left leg crossed over your opposite knee. Grasp your toes and gently pull them back toward the top of your foot. You should feel a stretch on the bottom of your toes and/or foot. Hold this stretch for 10 seconds. Now, gently pull your toes toward the bottom of your foot. You should feel a stretch on the top of your toes and or foot. Hold this stretch for 10 seconds. Repeat  times. Complete this stretch 3 times  per day.   RANGE OF MOTION - Ankle Dorsiflexion, Active Assisted Remove shoes and sit on a chair that is preferably not on a carpeted surface. Place right / left foot under knee. Extend your opposite leg for support. Keeping your heel down, slide your right / left foot back toward the chair until you feel a stretch at your ankle or calf. If you do not feel a stretch, slide your bottom forward to the edge of the chair, while still keeping your heel down. Hold this stretch for 10 seconds. Repeat 3 times. Complete this stretch 2 times per day.   STRETCH  Gastroc, Standing Place hands on wall. Extend right / left leg, keeping the front knee somewhat bent. Slightly point your toes inward on your back foot. Keeping your right / left heel on the floor and your knee straight, shift your weight toward the wall, not allowing your back to arch. You should feel a gentle stretch in the right / left calf. Hold this position for 10 seconds. Repeat 3 times. Complete this stretch 2 times per day.  STRETCH  Soleus, Standing Place hands on wall. Extend right / left leg, keeping the other knee somewhat bent. Slightly point your toes inward on your back foot. Keep your right / left heel on the floor, bend your back knee, and slightly shift your weight over the back leg so that you feel a gentle stretch deep in your back calf. Hold  this position for 10 seconds. Repeat 3 times. Complete this stretch 2 times per day.  STRETCH  Gastrocsoleus, Standing  Note: This exercise can place a lot of stress on your foot and ankle. Please complete this exercise only if specifically instructed by your caregiver.  Place the ball of your right / left foot on a step, keeping your other foot firmly on the same step. Hold on to the wall or a rail for balance. Slowly lift your other foot, allowing your body weight to press your heel down over the edge of the step. You should feel a stretch in your right / left calf. Hold this  position for 10 seconds. Repeat this exercise with a slight bend in your right / left knee. Repeat 3 times. Complete this stretch 2 times per day.   STRENGTHENING EXERCISES - Plantar Fasciitis (Heel Spur Syndrome)  These exercises may help you when beginning to rehabilitate your injury. They may resolve your symptoms with or without further involvement from your physician, physical therapist or athletic trainer. While completing these exercises, remember:  Muscles can gain both the endurance and the strength needed for everyday activities through controlled exercises. Complete these exercises as instructed by your physician, physical therapist or athletic trainer. Progress the resistance and repetitions only as guided.  STRENGTH - Towel Curls Sit in a chair positioned on a non-carpeted surface. Place your foot on a towel, keeping your heel on the floor. Pull the towel toward your heel by only curling your toes. Keep your heel on the floor. Repeat 3 times. Complete this exercise 2 times per day.  STRENGTH - Ankle Inversion Secure one end of a rubber exercise band/tubing to a fixed object (table, pole). Loop the other end around your foot just before your toes. Place your fists between your knees. This will focus your strengthening at your ankle. Slowly, pull your big toe up and in, making sure the band/tubing is positioned to resist the entire motion. Hold this position for 10 seconds. Have your muscles resist the band/tubing as it slowly pulls your foot back to the starting position. Repeat 3 times. Complete this exercises 2 times per day.  Document Released: 06/30/2005 Document Revised: 09/22/2011 Document Reviewed: 10/12/2008 [facility] Patient Information 2014 [place], [place].

## 2022-04-28 ENCOUNTER — Telehealth: Payer: Self-pay | Admitting: *Deleted

## 2022-04-28 ENCOUNTER — Ambulatory Visit (INDEPENDENT_AMBULATORY_CARE_PROVIDER_SITE_OTHER): Payer: 59 | Admitting: Podiatry

## 2022-04-28 DIAGNOSIS — L600 Ingrowing nail: Secondary | ICD-10-CM | POA: Insufficient documentation

## 2022-04-28 NOTE — Progress Notes (Signed)
Subjective: Chief Complaint  Patient presents with   Ingrown Toenail    Right hallux ingrown    61 year old female presents for above concerns.  She states that she has been soaking taking antibiotics.  She states it is still sore but the drainage and swelling is improved.  Objective: AAO x3, NAD DP/PT pulses palpable bilaterally, CRT less than 3 seconds Incurvation present to right lateral hallux nail border with localized edema.  There is no drainage or pus noted today and there is no ascending cellulitis.  There is no fluctuation or crepitation. No pain with calf compression, swelling, warmth, erythema  Assessment: Right lateral hallux ingrown toenail  Plan: -All treatment options discussed with the patient including all alternatives, risks, complications.  -At this time, the patient is requesting partial nail removal with chemical matricectomy to the symptomatic portion of the nail. Risks and complications were discussed with the patient for which they understand and written consent was obtained. Under sterile conditions a total of 3 mL of a mixture of 2% lidocaine plain and 0.5% Marcaine plain was infiltrated in a hallux block fashion. Once anesthetized, the skin was prepped in sterile fashion. A tourniquet was then applied. Next the lateral aspect of hallux nail border was then sharply excised making sure to remove the entire offending nail border. Once the nails were ensured to be removed area was debrided and the underlying skin was intact. There is no purulence identified in the procedure. Next phenol was then applied under standard conditions and copiously irrigated. Silvadene was applied. A dry sterile dressing was applied. After application of the dressing the tourniquet was removed and there is found to be an immediate capillary refill time to the digit. The patient tolerated the procedure well any complications. Post procedure instructions were discussed the patient for which he  verbally understood. Follow-up in one week for nail check or sooner if any problems are to arise. Discussed signs/symptoms of infection and directed to call the office immediately should any occur or go directly to the emergency room. In the meantime, encouraged to call the office with any questions, concerns, changes symptoms. -Finish course of antibiotic -Patient encouraged to call the office with any questions, concerns, change in symptoms.   Trula Slade DPM

## 2022-04-28 NOTE — Patient Instructions (Addendum)

## 2022-04-28 NOTE — Telephone Encounter (Signed)
Patient is calling to request that her after visit summary / post procedural instructions be sent to e-mail address on file.did not receive in office after visit.  Emailed to patient's, confirmation received 04/28/22.

## 2022-05-01 ENCOUNTER — Ambulatory Visit: Payer: 59 | Admitting: Podiatry

## 2022-05-12 ENCOUNTER — Other Ambulatory Visit: Payer: Self-pay | Admitting: Podiatry

## 2022-05-12 DIAGNOSIS — L600 Ingrowing nail: Secondary | ICD-10-CM

## 2022-05-16 ENCOUNTER — Ambulatory Visit (INDEPENDENT_AMBULATORY_CARE_PROVIDER_SITE_OTHER): Payer: 59

## 2022-05-16 DIAGNOSIS — L600 Ingrowing nail: Secondary | ICD-10-CM

## 2022-05-16 NOTE — Progress Notes (Signed)
Nail check for AP1. Pt is doing well. No throbbing, bleeding or pain. Dr. Jacqualyn Posey came to recheck nail by pt request. No follow up needed.

## 2022-05-22 ENCOUNTER — Telehealth: Payer: Self-pay | Admitting: Internal Medicine

## 2022-05-22 NOTE — Telephone Encounter (Signed)
error 

## 2022-06-18 ENCOUNTER — Telehealth: Payer: Self-pay | Admitting: Internal Medicine

## 2022-06-18 NOTE — Telephone Encounter (Signed)
Pt has a head cold and is requesting a call back from Dr. Quay Burow CMA. Pt  is scheduled for a virtual visit on 12.7.23 @ 11:20  Please call pt:  [phone]

## 2022-06-18 NOTE — Telephone Encounter (Signed)
Spoke with patient today. 

## 2022-06-19 ENCOUNTER — Encounter: Payer: Self-pay | Admitting: Internal Medicine

## 2022-06-19 ENCOUNTER — Telehealth (INDEPENDENT_AMBULATORY_CARE_PROVIDER_SITE_OTHER): Payer: 59 | Admitting: Internal Medicine

## 2022-06-19 ENCOUNTER — Telehealth: Payer: 59 | Admitting: Family Medicine

## 2022-06-19 DIAGNOSIS — J019 Acute sinusitis, unspecified: Secondary | ICD-10-CM | POA: Insufficient documentation

## 2022-06-19 DIAGNOSIS — J0100 Acute maxillary sinusitis, unspecified: Secondary | ICD-10-CM

## 2022-06-19 MED ORDER — AMOXICILLIN-POT CLAVULANATE 875-125 MG PO TABS: 1.0000 | ORAL_TABLET | Freq: Two times a day (BID) | ORAL | 0 refills | Status: DC

## 2022-06-19 MED ORDER — HYDROCOD POLI-CHLORPHE POLI ER 10-8 MG/5ML PO SUER: 5.0000 mL | Freq: Two times a day (BID) | ORAL | 0 refills | Status: DC | PRN

## 2022-06-19 NOTE — Assessment & Plan Note (Signed)
Acute Likely bacterial  Start Augmentin 875-125 mg BID x 10 day Tussionex cough syrup otc cold medications Rest, fluid Call if no improvement

## 2022-06-19 NOTE — Progress Notes (Signed)
Virtual Visit via Video Note  I connected with Miranda Guerra on 06/19/22 at  8:10 AM EST by a video enabled telemedicine application and verified that I am speaking with the correct person using two identifiers.   I discussed the limitations of evaluation and management by telemedicine and the availability of in person appointments. The patient expressed understanding and agreed to proceed.  Present for the visit:  Myself, Dr Billey Gosling, Dyann Ruddle.  The patient is currently at home and I am in the office.    No referring provider.    History of Present Illness: She is here for an acute visit for cold symptoms.   Her symptoms started 10 + days  She is experiencing ear pain and the feeling of the ears being clogged-specially the left ear, nasal congestion, sinus pain, sore throat, Cough that is productive of discolored mucus and headaches.  She has tried taking  tylenol cold and flu, cough syrup, mucinex, flonase   Covid neg x 2  Review of Systems  Constitutional:  Negative for fever.  HENT:  Positive for congestion, ear pain (initally had ear pain initially - resolved - left ear aches), sinus pain and sore throat.        PND  Respiratory:  Positive for cough (productive). Negative for shortness of breath and wheezing.   Neurological:  Positive for headaches. Negative for dizziness.      Social History   Socioeconomic History   Marital status: Married    Spouse name: Not on file   Number of children: 0   Years of education: Not on file   Highest education level: Not on file  Occupational History   Not on file  Tobacco Use   Smoking status: Former    Types: Cigarettes    Quit date: 07/15/1987    Years since quitting: 34.9   Smokeless tobacco: Never  Substance and Sexual Activity   Alcohol use: Yes    Alcohol/week: 4.0 standard drinks of alcohol    Types: 4 Glasses of wine per week   Drug use: No   Sexual activity: Never  Other Topics Concern   Not on file   Social History Narrative   Exercises 4/week - gym   Married , no children   Social Determinants of Health   Financial Resource Strain: Not on file  Food Insecurity: Not on file  Transportation Needs: Not on file  Physical Activity: Not on file  Stress: Not on file  Social Connections: Not on file     Observations/Objective: Appears well in NAD Breathing normally  Assessment and Plan:  See Problem List for Assessment and Plan of chronic medical problems.   Follow Up Instructions:    I discussed the assessment and treatment plan with the patient. The patient was provided an opportunity to ask questions and all were answered. The patient agreed with the plan and demonstrated an understanding of the instructions.   The patient was advised to call back or seek an in-person evaluation if the symptoms worsen or if the condition fails to improve as anticipated.    Binnie Rail, MD

## 2022-06-29 ENCOUNTER — Encounter: Payer: Self-pay | Admitting: Internal Medicine

## 2022-06-29 NOTE — Progress Notes (Unsigned)
Subjective:    Patient ID: Miranda Guerra, female    DOB: 03-28-1961, 61 y.o.   MRN: [id number]      HPI Yuette is here for a Physical exam.   No concerns.  Reviewed blood work   Medications and allergies reviewed with patient and updated if appropriate.  Current Outpatient Medications on File Prior to Visit  Medication Sig Dispense Refill   ALLEGRA-D ALLERGY & CONGESTION 60-120 MG 12 hr tablet TAKE 1 TABLET BY MOUTH TWICE A DAY 180 tablet 1   ALPRAZolam (XANAX) 0.25 MG tablet Take 1 tablet (0.25 mg total) by mouth 2 (two) times daily as needed for anxiety. 30 tablet 2   Cholecalciferol (VITAMIN D) 50 MCG (2000 UT) CAPS Take by mouth.     CLENPIQ 10-3.5-12 MG-GM -GM/160ML SOLN Take by mouth.     fluticasone (FLONASE) 50 MCG/ACT nasal spray SPRAY 2 SPRAYS INTO EACH NOSTRIL EVERY DAY 48 mL 3   rosuvastatin (CRESTOR) 5 MG tablet TAKE 1 TABLET (5 MG TOTAL) BY MOUTH DAILY. 90 tablet 1   No current facility-administered medications on file prior to visit.    Review of Systems  Constitutional:  Negative for fever.  Eyes:  Negative for visual disturbance.  Respiratory:  Positive for cough (lingering cough from sinus infection). Negative for shortness of breath and wheezing.   Cardiovascular:  Negative for chest pain, palpitations and leg swelling.  Gastrointestinal:  Negative for abdominal pain, blood in stool, constipation and diarrhea.  Genitourinary:  Negative for dysuria.  Musculoskeletal:  Negative for arthralgias and back pain.  Skin:  Negative for rash.  Neurological:  Negative for light-headedness and headaches.  Psychiatric/Behavioral:  Negative for dysphoric mood. The patient is not nervous/anxious.        Objective:   Vitals:   07/02/22 0920  BP: 114/74  Pulse: 72  Temp: 98.1 F (36.7 C)  SpO2: 98%   Filed Weights   07/02/22 0920  Weight: 187 lb (84.8 kg)   Body mass index is 29.29 kg/m.  BP Readings from Last 3 Encounters:  07/02/22 114/74   12/26/21 124/74  07/03/21 118/70    Wt Readings from Last 3 Encounters:  07/02/22 187 lb (84.8 kg)  12/26/21 186 lb 3.2 oz (84.5 kg)  04/03/21 181 lb (82.1 kg)       Physical Exam Constitutional: She appears well-developed and well-nourished. No distress.  HENT:  Head: Normocephalic and atraumatic.  Right Ear: External ear normal. Normal ear canal and TM Left Ear: External ear normal.  Normal ear canal and TM Mouth/Throat: Oropharynx is clear and moist.  Eyes: Conjunctivae normal.  Neck: Neck supple. No tracheal deviation present. No thyromegaly present.  No carotid bruit  Cardiovascular: Normal rate, regular rhythm and normal heart sounds.   No murmur heard.  No edema. Pulmonary/Chest: Effort normal and breath sounds normal. No respiratory distress. She has no wheezes. She has no rales.  Breast: deferred   Abdominal: Soft. She exhibits no distension. There is no tenderness.  Lymphadenopathy: She has no cervical adenopathy.  Skin: Skin is warm and dry. She is not diaphoretic.  Psychiatric: She has a normal mood and affect. Her behavior is normal.     Lab Results  Component Value Date   WBC 6.5 06/30/2022   HGB 13.7 06/30/2022   HCT 41.1 06/30/2022   PLT 270.0 06/30/2022   GLUCOSE 97 06/30/2022   CHOL 201 (H) 06/30/2022   TRIG 186.0 (H) 06/30/2022   HDL 53.20 06/30/2022  LDLCALC 110 (H) 06/30/2022   ALT 23 06/30/2022   AST 20 06/30/2022   NA 143 06/30/2022   K 4.3 06/30/2022   CL 106 06/30/2022   CREATININE 0.85 06/30/2022   BUN 17 06/30/2022   CO2 27 06/30/2022   TSH 4.26 06/30/2022   HGBA1C 5.6 07/02/2021         Assessment & Plan:   Physical exam: Screening blood work reviewed Exercise  regular Weight  will work on weight loss Substance abuse  none   Reviewed recommended immunizations.   Health Maintenance  Topic Date Due   Zoster Vaccines- Shingrix (1 of 2) Never done   MAMMOGRAM  07/09/2019   INFLUENZA VACCINE  02/11/2022   COVID-19  Vaccine (4 - 2023-24 season) 03/14/2022   DTaP/Tdap/Td (2 - Td or Tdap) 07/14/2022   COLONOSCOPY (Pts 45-22yr Insurance coverage will need to be confirmed)  07/01/2024   PAP SMEAR-Modifier  08/21/2026   HPV VACCINES  Aged Out   Hepatitis C Screening  Discontinued   HIV Screening  Discontinued          See Problem List for Assessment and Plan of chronic medical problems.

## 2022-06-29 NOTE — Patient Instructions (Addendum)
Medications changes include :   None     Return in about 1 year (around 07/03/2023) for Physical Exam.   Health Maintenance, Female Adopting a healthy lifestyle and getting preventive care are important in promoting health and wellness. Ask your health care provider about: The right schedule for you to have regular tests and exams. Things you can do on your own to prevent diseases and keep yourself healthy. What should I know about diet, weight, and exercise? Eat a healthy diet  Eat a diet that includes plenty of vegetables, fruits, low-fat dairy products, and lean protein. Do not eat a lot of foods that are high in solid fats, added sugars, or sodium. Maintain a healthy weight Body mass index (BMI) is used to identify weight problems. It estimates body fat based on height and weight. Your health care provider can help determine your BMI and help you achieve or maintain a healthy weight. Get regular exercise Get regular exercise. This is one of the most important things you can do for your health. Most adults should: Exercise for at least 150 minutes each week. The exercise should increase your heart rate and make you sweat (moderate-intensity exercise). Do strengthening exercises at least twice a week. This is in addition to the moderate-intensity exercise. Spend less time sitting. Even light physical activity can be beneficial. Watch cholesterol and blood lipids Have your blood tested for lipids and cholesterol at 62 years of age, then have this test every 5 years. Have your cholesterol levels checked more often if: Your lipid or cholesterol levels are high. You are older than 62 years of age. You are at high risk for heart disease. What should I know about cancer screening? Depending on your health history and family history, you may need to have cancer screening at various ages. This may include screening for: Breast cancer. Cervical cancer. Colorectal cancer. Skin  cancer. Lung cancer. What should I know about heart disease, diabetes, and high blood pressure? Blood pressure and heart disease High blood pressure causes heart disease and increases the risk of stroke. This is more likely to develop in people who have high blood pressure readings or are overweight. Have your blood pressure checked: Every 3-5 years if you are 96-89 years of age. Every year if you are 70 years old or older. Diabetes Have regular diabetes screenings. This checks your fasting blood sugar level. Have the screening done: Once every three years after age 35 if you are at a normal weight and have a low risk for diabetes. More often and at a younger age if you are overweight or have a high risk for diabetes. What should I know about preventing infection? Hepatitis B If you have a higher risk for hepatitis B, you should be screened for this virus. Talk with your health care provider to find out if you are at risk for hepatitis B infection. Hepatitis C Testing is recommended for: Everyone born from [birth year] through 1966. Anyone with known risk factors for hepatitis C. Sexually transmitted infections (STIs) Get screened for STIs, including gonorrhea and chlamydia, if: You are sexually active and are younger than 62 years of age. You are older than 62 years of age and your health care provider tells you that you are at risk for this type of infection. Your sexual activity has changed since you were last screened, and you are at increased risk for chlamydia or gonorrhea. Ask your health care provider if you are at risk.  Ask your health care provider about whether you are at high risk for HIV. Your health care provider may recommend a prescription medicine to help prevent HIV infection. If you choose to take medicine to prevent HIV, you should first get tested for HIV. You should then be tested every 3 months for as long as you are taking the medicine. Pregnancy If you are about to stop  having your period (premenopausal) and you may become pregnant, seek counseling before you get pregnant. Take 400 to 800 micrograms (mcg) of folic acid every day if you become pregnant. Ask for birth control (contraception) if you want to prevent pregnancy. Osteoporosis and menopause Osteoporosis is a disease in which the bones lose minerals and strength with aging. This can result in bone fractures. If you are 62 years old or older, or if you are at risk for osteoporosis and fractures, ask your health care provider if you should: Be screened for bone loss. Take a calcium or vitamin D supplement to lower your risk of fractures. Be given hormone replacement therapy (HRT) to treat symptoms of menopause. Follow these instructions at home: Alcohol use Do not drink alcohol if: Your health care provider tells you not to drink. You are pregnant, may be pregnant, or are planning to become pregnant. If you drink alcohol: Limit how much you have to: 0-1 drink a day. Know how much alcohol is in your drink. In the [place] one drink equals one 12 oz bottle of beer (355 mL), one 5 oz glass of wine (148 mL), or one 1 oz glass of hard liquor (44 mL). Lifestyle Do not use any products that contain nicotine or tobacco. These products include cigarettes, chewing tobacco, and vaping devices, such as e-cigarettes. If you need help quitting, ask your health care provider. Do not use street drugs. Do not share needles. Ask your health care provider for help if you need support or information about quitting drugs. General instructions Schedule regular health, dental, and eye exams. Stay current with your vaccines. Tell your health care provider if: You often feel depressed. You have ever been abused or do not feel safe at home. Summary Adopting a healthy lifestyle and getting preventive care are important in promoting health and wellness. Follow your health care provider's instructions about healthy diet,  exercising, and getting tested or screened for diseases. Follow your health care provider's instructions on monitoring your cholesterol and blood pressure. This information is not intended to replace advice given to you by your health care provider. Make sure you discuss any questions you have with your health care provider. Document Revised: 11/19/2020 Document Reviewed: 11/19/2020 Elsevier Patient Education  [place].

## 2022-06-30 ENCOUNTER — Other Ambulatory Visit: Payer: 59

## 2022-06-30 ENCOUNTER — Other Ambulatory Visit (INDEPENDENT_AMBULATORY_CARE_PROVIDER_SITE_OTHER): Payer: 59

## 2022-06-30 DIAGNOSIS — F419 Anxiety disorder, unspecified: Secondary | ICD-10-CM | POA: Diagnosis not present

## 2022-06-30 DIAGNOSIS — E782 Mixed hyperlipidemia: Secondary | ICD-10-CM

## 2022-06-30 LAB — COMPREHENSIVE METABOLIC PANEL
ALT: 23 U/L (ref 0–35)
AST: 20 U/L (ref 0–37)
Albumin: 4.4 g/dL (ref 3.5–5.2)
Alkaline Phosphatase: 62 U/L (ref 39–117)
BUN: 17 mg/dL (ref 6–23)
CO2: 27 mEq/L (ref 19–32)
Calcium: 9.2 mg/dL (ref 8.4–10.5)
Chloride: 106 mEq/L (ref 96–112)
Creatinine, Ser: 0.85 mg/dL (ref 0.40–1.20)
GFR: 73.49 mL/min (ref >60.00)
Glucose, Bld: 97 mg/dL (ref 70–99)
Potassium: 4.3 mEq/L (ref 3.5–5.1)
Sodium: 143 mEq/L (ref 135–145)
Total Bilirubin: 0.5 mg/dL (ref 0.2–1.2)
Total Protein: 7.1 g/dL (ref 6.0–8.3)

## 2022-06-30 LAB — CBC WITH DIFFERENTIAL/PLATELET
Basophils Absolute: 0.1 10*3/uL (ref 0.0–0.1)
Basophils Relative: 1 % (ref 0.0–3.0)
Eosinophils Absolute: 0.1 10*3/uL (ref 0.0–0.7)
Eosinophils Relative: 2.1 % (ref 0.0–5.0)
HCT: 41.1 % (ref 36.0–46.0)
Hemoglobin: 13.7 g/dL (ref 12.0–15.0)
Lymphocytes Relative: 38.7 % (ref 12.0–46.0)
Lymphs Abs: 2.5 10*3/uL (ref 0.7–4.0)
MCHC: 33.4 g/dL (ref 30.0–36.0)
MCV: 88.9 fl (ref 78.0–100.0)
Monocytes Absolute: 0.5 10*3/uL (ref 0.1–1.0)
Monocytes Relative: 8.1 % (ref 3.0–12.0)
Neutro Abs: 3.3 10*3/uL (ref 1.4–7.7)
Neutrophils Relative %: 50.1 % (ref 43.0–77.0)
Platelets: 270 10*3/uL (ref 150.0–400.0)
RBC: 4.62 Mil/uL (ref 3.87–5.11)
RDW: 13.4 % (ref 11.5–15.5)
WBC: 6.5 10*3/uL (ref 4.0–10.5)

## 2022-06-30 LAB — TSH: TSH: 4.26 u[IU]/mL (ref 0.35–5.50)

## 2022-06-30 LAB — LIPID PANEL
Cholesterol: 201 mg/dL — ABNORMAL HIGH (ref 0–200)
HDL: 53.2 mg/dL (ref >39.00)
LDL Cholesterol: 110 mg/dL — ABNORMAL HIGH (ref 0–99)
NonHDL: 147.53
Total CHOL/HDL Ratio: 4
Triglycerides: 186 mg/dL — ABNORMAL HIGH (ref 0.0–149.0)
VLDL: 37.2 mg/dL (ref 0.0–40.0)

## 2022-07-02 ENCOUNTER — Ambulatory Visit (INDEPENDENT_AMBULATORY_CARE_PROVIDER_SITE_OTHER): Payer: 59 | Admitting: Internal Medicine

## 2022-07-02 VITALS — BP 114/74 | HR 72 | Temp 98.1°F | Ht 67.0 in | Wt 187.0 lb

## 2022-07-02 DIAGNOSIS — Z0000 Encounter for general adult medical examination without abnormal findings: Secondary | ICD-10-CM

## 2022-07-02 DIAGNOSIS — E782 Mixed hyperlipidemia: Secondary | ICD-10-CM

## 2022-07-02 DIAGNOSIS — Z8249 Family history of ischemic heart disease and other diseases of the circulatory system: Secondary | ICD-10-CM | POA: Insufficient documentation

## 2022-07-02 DIAGNOSIS — F419 Anxiety disorder, unspecified: Secondary | ICD-10-CM

## 2022-07-02 NOTE — Assessment & Plan Note (Signed)
Chronic Regular exercise and healthy diet encouraged Check lipid panel, CMP, TSH Continue continue Crestor 5 mg daily

## 2022-07-02 NOTE — Assessment & Plan Note (Signed)
Chronic Situational Continue alprazolam 0.25 mg twice daily as needed

## 2022-07-04 ENCOUNTER — Ambulatory Visit: Payer: 59 | Admitting: Internal Medicine

## 2022-07-17 ENCOUNTER — Other Ambulatory Visit: Payer: Self-pay | Admitting: Internal Medicine

## 2022-07-29 ENCOUNTER — Other Ambulatory Visit: Payer: Self-pay

## 2022-07-29 ENCOUNTER — Other Ambulatory Visit: Payer: Self-pay | Admitting: Internal Medicine

## 2022-08-30 LAB — HM MAMMOGRAPHY

## 2022-09-17 ENCOUNTER — Encounter: Payer: Self-pay | Admitting: Internal Medicine

## 2022-09-17 NOTE — Progress Notes (Signed)
Outside notes received. Information abstracted. Notes sent to scan.  

## 2022-10-23 ENCOUNTER — Telehealth: Payer: Self-pay | Admitting: Podiatry

## 2022-10-23 MED ORDER — CEPHALEXIN 500 MG PO CAPS: 500.0000 mg | ORAL_CAPSULE | Freq: Three times a day (TID) | ORAL | 0 refills | Status: AC

## 2022-10-23 NOTE — Telephone Encounter (Signed)
Patient called and lvm stating that she had an ingrown last October. She stated that her nail is very sore and puff on the side. She wanted to see if this is normal and she really didn't want to have to come if this is normal  Please advise

## 2022-11-06 ENCOUNTER — Ambulatory Visit (INDEPENDENT_AMBULATORY_CARE_PROVIDER_SITE_OTHER): Payer: 59 | Admitting: Podiatry

## 2022-11-06 ENCOUNTER — Encounter: Payer: Self-pay | Admitting: Podiatry

## 2022-11-06 DIAGNOSIS — L600 Ingrowing nail: Secondary | ICD-10-CM | POA: Diagnosis not present

## 2022-11-06 NOTE — Patient Instructions (Signed)

## 2022-11-08 NOTE — Progress Notes (Signed)
Subjective: Chief Complaint  Patient presents with   Toe Pain    Hallux right - lateral border, had ingrown toenail worked on 3 times, been bothering her again, red, swollen, took antibiotic (just finished it Monday)  - does feel some better, "but something isn't right"    62 year old female presents for above concerns.  She recently just finished a course of antibiotics and she states that she is having some soreness to the left side of the toenails been removed previously.  Currently denies any drainage or pus.  No injuries.     Objective: AAO x3, NAD DP/PT pulses palpable bilaterally, CRT less than 3 seconds Incurvation present to right lateral hallux nail border with localized edema.  It appears that the nail has been removed and starting to grow back and it is tender at the distal portion at the tip of the nail where it is coming back in.  There is localized edema but is no drainage or pus or ascending cellulitis.  No fluctuance or crepitation No pain with calf compression, swelling, warmth, erythema  Assessment: Right lateral hallux ingrown toenail  Plan: -All treatment options discussed with the patient including all alternatives, risks, complications.  -We again discussed the reoccurrence of ingrown toenail.  We discussed different treatment options both conservative as well as surgical.  I did clean up the corner of the nail.  There was some skin buildup which was able to debride.  Ultimately redness to the nail continues to grow out and see if this will resolve on its own but we discussed Epsom salt soaks, antibiotic ointment dressing changes.  If no improvement or any worsening we discussed repeat partial nail avulsion however with a sodium hydroxide or a winograd type procedure.   Return if symptoms worsen or fail to improve.  Vivi Barrack DPM

## 2022-12-19 ENCOUNTER — Telehealth: Payer: Self-pay | Admitting: Podiatry

## 2022-12-19 NOTE — Telephone Encounter (Signed)
Had toe done 3 times and you had the discussion last visit and she would like you to call her please.

## 2023-01-17 ENCOUNTER — Other Ambulatory Visit: Payer: Self-pay | Admitting: Internal Medicine

## 2023-01-22 ENCOUNTER — Telehealth: Payer: Self-pay | Admitting: Internal Medicine

## 2023-01-22 ENCOUNTER — Ambulatory Visit (INDEPENDENT_AMBULATORY_CARE_PROVIDER_SITE_OTHER): Payer: 59 | Admitting: Internal Medicine

## 2023-01-22 ENCOUNTER — Ambulatory Visit (INDEPENDENT_AMBULATORY_CARE_PROVIDER_SITE_OTHER): Payer: 59

## 2023-01-22 VITALS — BP 120/76 | HR 76 | Temp 98.1°F | Resp 16 | Ht 67.0 in | Wt 188.0 lb

## 2023-01-22 DIAGNOSIS — Z23 Encounter for immunization: Secondary | ICD-10-CM

## 2023-01-22 DIAGNOSIS — S60419A Abrasion of unspecified finger, initial encounter: Secondary | ICD-10-CM | POA: Diagnosis not present

## 2023-01-22 DIAGNOSIS — S6991XA Unspecified injury of right wrist, hand and finger(s), initial encounter: Secondary | ICD-10-CM | POA: Insufficient documentation

## 2023-01-22 DIAGNOSIS — S63654A Sprain of metacarpophalangeal joint of right ring finger, initial encounter: Secondary | ICD-10-CM | POA: Diagnosis not present

## 2023-01-22 MED ORDER — OXYCODONE-ACETAMINOPHEN 7.5-325 MG PO TABS
1.0000 | ORAL_TABLET | Freq: Three times a day (TID) | ORAL | 0 refills | Status: AC | PRN
Start: 2023-01-22 — End: 2023-01-27

## 2023-01-22 NOTE — Progress Notes (Signed)
Subjective:  Patient ID: Miranda Guerra, female    DOB: 1960-10-18  Age: 62 y.o. MRN: [id number]  CC: Hand Pain (Right ring finger pain and swelling; Patient was walking her dog this morning and hit hand on brick (pain and swelling))   HPI Miranda Guerra presents for f/up ---  Discussed the use of AI scribe software for clinical note transcription with the patient, who gave verbal consent to proceed.  History of Present Illness   The patient presents with an injury to the right ring finger sustained earlier in the day. The injury occurred when the patient's hand impacted a brick wall, specifically entering an opening within the wall. Following the incident, the patient reports bruising and swelling of the affected finger. The patient is unable to fully bend the finger, with limited mobility restricted to the proximal segments. Additionally, the patient notes a superficial scratch on the hand.  She has tried motrin  600 mg for the pain without much relief.      Outpatient Medications Prior to Visit  Medication Sig Dispense Refill   ALLEGRA-D ALLERGY & CONGESTION 60-120 MG 12 hr tablet TAKE 1 TABLET BY MOUTH TWICE A DAY 180 tablet 1   ALPRAZolam (XANAX) 0.25 MG tablet Take 1 tablet (0.25 mg total) by mouth 2 (two) times daily as needed for anxiety. 30 tablet 2   Cholecalciferol (VITAMIN D) 50 MCG (2000 UT) CAPS Take by mouth.     CLENPIQ 10-3.5-12 MG-GM -GM/160ML SOLN Take by mouth.     fluticasone (FLONASE) 50 MCG/ACT nasal spray SPRAY 2 SPRAYS INTO EACH NOSTRIL EVERY DAY 48 mL 3   rosuvastatin (CRESTOR) 5 MG tablet TAKE 1 TABLET (5 MG TOTAL) BY MOUTH DAILY. 90 tablet 1   No facility-administered medications prior to visit.    ROS Review of Systems  Constitutional: Negative.   HENT: Negative.    Eyes: Negative.   Respiratory:  Negative for chest tightness, shortness of breath and wheezing.   Gastrointestinal:  Negative for abdominal pain, constipation, diarrhea and  vomiting.  Endocrine: Negative.   Genitourinary: Negative.  Negative for difficulty urinating.  Musculoskeletal:  Positive for arthralgias and joint swelling.  Skin: Negative.   Neurological:  Negative for dizziness, weakness, light-headedness and headaches.  Hematological:  Negative for adenopathy. Does not bruise/bleed easily.  Psychiatric/Behavioral: Negative.      Objective:  BP 120/76   Pulse 76   Temp 98.1 F (36.7 C) (Oral)   Resp 16   Ht 5\' 7"  (1.702 m)   Wt 188 lb (85.3 kg)   LMP 12/12/2012   SpO2 96%   BMI 29.44 kg/m   BP Readings from Last 3 Encounters:  01/22/23 120/76  07/02/22 114/74  12/26/21 124/74    Wt Readings from Last 3 Encounters:  01/22/23 188 lb (85.3 kg)  07/02/22 187 lb (84.8 kg)  12/26/21 186 lb 3.2 oz (84.5 kg)    Physical Exam Vitals reviewed.  Constitutional:      Appearance: Normal appearance.  HENT:     Nose: Nose normal.     Mouth/Throat:     Mouth: Mucous membranes are moist.  Eyes:     General: No scleral icterus.    Conjunctiva/sclera: Conjunctivae normal.  Cardiovascular:     Rate and Rhythm: Normal rate and regular rhythm.     Heart sounds: No murmur heard. Pulmonary:     Effort: Pulmonary effort is normal.     Breath sounds: No stridor. No wheezing,  rhonchi or rales.  Abdominal:     General: Abdomen is flat.     Tenderness: There is no abdominal tenderness. There is no guarding.     Hernia: No hernia is present.  Musculoskeletal:        General: Swelling, tenderness and deformity present.     Right upper arm: Normal.     Left upper arm: Normal.     Right elbow: Normal. No swelling or deformity. Normal range of motion.     Left elbow: Normal. No swelling or deformity. Normal range of motion.     Right forearm: Normal.     Left forearm: Normal.     Right lower leg: No edema.     Left lower leg: No edema.     Comments: At the base of the right ring finger there is diffuse ecchymosis, swelling, and tenderness to  palpation.  There is decreased range of motion in the MCP joint and the interphalangeal joints.  There is weakness with flexion and extended and pain with range of motion.  Distally there is good sensation and capillary refill.  There is a small abrasion.  Skin:    Findings: Bruising present.  Neurological:     General: No focal deficit present.     Mental Status: She is alert and oriented to person, place, and time.     Lab Results  Component Value Date   WBC 6.5 06/30/2022   HGB 13.7 06/30/2022   HCT 41.1 06/30/2022   PLT 270.0 06/30/2022   GLUCOSE 97 06/30/2022   CHOL 201 (H) 06/30/2022   TRIG 186.0 (H) 06/30/2022   HDL 53.20 06/30/2022   LDLCALC 110 (H) 06/30/2022   ALT 23 06/30/2022   AST 20 06/30/2022   NA 143 06/30/2022   K 4.3 06/30/2022   CL 106 06/30/2022   CREATININE 0.85 06/30/2022   BUN 17 06/30/2022   CO2 27 06/30/2022   TSH 4.26 06/30/2022   HGBA1C 5.6 07/02/2021    CT CARDIAC SCORING (SELF PAY ONLY)  Addendum Date: 09/09/2021   ADDENDUM REPORT: 09/09/2021 09:41 CLINICAL DATA:  Risk stratification: 62 Year-old White Female EXAM: Coronary Calcium Score TECHNIQUE: The patient was scanned on a [employer]. Axial non-contrast 3 mm slices were carried out through the heart. The data set was analyzed on a dedicated work station and scored using the Agatson method. FINDINGS: Non-cardiac: See separate report from [facility]. Ascending Aorta: Normal caliber. Aortic atherosclerosis. Pericardium: Normal. Coronary arteries: Normal origins. Coronary Calcium Score: Left main: 0 Left anterior descending artery: 0 Left circumflex artery: 0 Right coronary artery: 0 Total: 0 Percentile: 1st for age, sex, and race matched control. IMPRESSION: 1. Coronary calcium score of 0. This was 1st percentile for age, gender, and race matched controls. 2.  Aortic atherosclerosis. RECOMMENDATIONS: Coronary artery calcium (CAC) score is a strong predictor of incident coronary  heart disease (CHD) and provides predictive information beyond traditional risk factors. CAC scoring is reasonable to use in the decision to withhold, postpone, or initiate statin therapy in intermediate-risk or selected borderline-risk asymptomatic adults (age 2-75 years and LDL-C >=70 to <190 mg/dL) who do not have diabetes or established atherosclerotic cardiovascular disease (ASCVD).* In intermediate-risk (10-year ASCVD risk >=7.5% to <20%) adults or selected borderline-risk (10-year ASCVD risk >=5% to <7.5%) adults in whom a CAC score is measured for the purpose of making a treatment decision the following recommendations have been made: If CAC = 0, it is reasonable to withhold statin therapy and  reassess in 5 to 10 years, as long as higher risk conditions are absent (diabetes mellitus, family history of premature CHD in first degree relatives (males <55 years; females <65 years), cigarette smoking, LDL >=190 mg/dL or other independent risk factors). If CAC is 1 to 99, it is reasonable to initiate statin therapy for patients >=70 years of age. If CAC is >=100 or >=75th percentile, it is reasonable to initiate statin therapy at any age. Cardiology referral should be considered for patients with CAC scores =400 or >=75th percentile. *2018 AHA/ACC/AACVPR/AAPA/ABC/ACPM/ADA/AGS/APhA/ASPC/NLA/PCNA Guideline on the Management of Blood Cholesterol: A Report of the American College of Cardiology/American Heart Association Task Force on Clinical Practice Guidelines. J Am Coll Cardiol. 2019;73(24):3168-3209. Riley Lam, MD Electronically Signed   By: Riley Lam M.D.   On: 09/09/2021 09:41   Result Date: 09/09/2021 EXAM: OVER-READ INTERPRETATION  CT CHEST The following report is an over-read performed by radiologist Dr. Irish Lack of [facility] on 09/09/2021. This over-read does not include interpretation of cardiac or coronary anatomy or pathology. The coronary calcium  score/coronary CTA interpretation by the cardiologist is attached. COMPARISON:  None. FINDINGS: Vascular: No significant noncardiac vascular findings. Mediastinum/Nodes: Visualized mediastinum and hilar regions demonstrate no lymphadenopathy or masses. Lungs/Pleura: Visualized lungs show no evidence of pulmonary edema, consolidation, pneumothorax, nodule or pleural fluid. Upper Abdomen: No acute abnormality. Musculoskeletal: No chest wall mass or suspicious bone lesions identified. IMPRESSION: No significant incidental findings. Electronically Signed: By: Irish Lack M.D. On: 09/09/2021 09:31   DG Finger Ring Right  Result Date: 01/22/2023 CLINICAL DATA:  Patient c/o RRF pain, swelling, and bruising after an injury while walking her dog this morning. EXAM: RIGHT RING FINGER 2+V COMPARISON:  None available FINDINGS: No fracture or dislocation. Diffuse soft tissue swelling of the fourth digit. IMPRESSION: Diffuse soft tissue swelling of the fourth digit without underlying fracture. If the patient has continued symptoms, repeat radiographs in 10-14 days. Electronically Signed   By: Acquanetta Belling M.D.   On: 01/22/2023 12:29     Assessment & Plan:   Injury of ring finger, right, initial encounter- X-ray is negative for fracture. -     DG Finger Ring Right; Future -     oxyCODONE-Acetaminophen; Take 1 tablet by mouth every 8 (eight) hours as needed for up to 5 days for severe pain.  Dispense: 15 tablet; Refill: 0 -     Ambulatory referral to Orthopedic Surgery  Abrasion of finger, initial encounter- She was given a Tdap. -     oxyCODONE-Acetaminophen; Take 1 tablet by mouth every 8 (eight) hours as needed for up to 5 days for severe pain.  Dispense: 15 tablet; Refill: 0  Sprain of metacarpophalangeal (MCP) joint of right ring finger, initial encounter- I am concerned there may be some tendon injuries.  Will control the pain with oxycodone, acetaminophen, and ibuprofen.  The injured area was splinted.   She was asked to ice and elevate the right hand.  Plain films are negative for fracture.  I have asked her to see hand surgery within the next 3 to 5 days. -     oxyCODONE-Acetaminophen; Take 1 tablet by mouth every 8 (eight) hours as needed for up to 5 days for severe pain.  Dispense: 15 tablet; Refill: 0 -     Ambulatory referral to Orthopedic Surgery     Follow-up: Return if symptoms worsen or fail to improve.  Sanda Linger, MD

## 2023-01-22 NOTE — Telephone Encounter (Signed)
Patient thinks she has broke her finger and needs to know what to do .  Please call:  [phone]

## 2023-01-22 NOTE — Telephone Encounter (Signed)
Spoke with patient. She is coming in today to see Dr. Yetta Barre.

## 2023-01-22 NOTE — Patient Instructions (Signed)
Finger Sprain, Adult A finger sprain is a tear or stretch in a ligament in a finger. Ligaments are tissues that connect bones to each other. What are the causes? Finger sprains happen when something makes the bones in the hand move in an abnormal way. They are often caused by a fall or an accident. What increases the risk? This condition is more likely to develop in people who: Participate in sports in which it is easy to fall, such as skiing. Play sports that involve catching an object, such as basketball. Have poor strength and flexibility. What are the signs or symptoms? Symptoms of this condition include: Pain or tenderness in the finger. Swelling in the finger. A bluish appearance to the finger. Bruising. Difficulty bending and flexing the finger. How is this diagnosed? This condition is diagnosed with an exam of your finger. Your health care provider may take an X-ray to see if any bones are broken or dislocated. How is this treated? Treatment for this condition depends on how severe the sprain is. It may involve: Preventing the finger from moving for a period of time. Your finger may be wrapped in a bandage (dressing) or splint, or your finger may be taped to the fingers beside it (buddy taping). Medicines for pain. Exercises to strengthen the finger. These may be recommended when the finger has healed. Surgery to reconnect the ligament to a bone. This may be done if the ligament was completely torn. Follow these instructions at home: If you have a removable splint: Wear the splint as told by your health care provider. Remove it only as told by your health care provider. Check the skin around the splint every day. Tell your health care provider about any concerns. Loosen the splint if your fingers tingle, become numb, or turn cold and blue. Keep the splint clean. If the splint is not waterproof: Do not let it get wet. Cover it with a watertight covering when you take a bath or  shower. Managing pain, stiffness, and swelling If directed, put ice on the injured area. To do this: If you have a removable splint, remove it as told by your health care provider. Put ice in a plastic bag. Place a towel between your skin and the bag. Leave the ice on for 20 minutes, 2-3 times a day. Remove the ice if your skin turns bright red. This is very important. If you cannot feel pain, heat, or cold, you have a greater risk of damage to the area. Move your fingers often to reduce stiffness and swelling. Raise (elevate) the injured area above the level of your heart while you are sitting or lying down. Medicines Take over-the-counter and prescription medicines only as told by your health care provider. Ask your health care provider if the medicine prescribed to you requires you to avoid driving or using machinery. General instructions Keep any dressings dry until your health care provider says they can be removed. If your fingers are buddy taped, replace your buddy taping as told by your health care provider. Do exercises as told by your health care provider or physical therapist. Do not wear rings on your injured finger. Keep all follow-up visits. This is important. Contact a health care provider if: Your pain is not controlled with medicine. Your bruising or swelling gets worse. Your splint is damaged. You develop a fever. Get help right away if: Your finger is numb or blue. Your finger feels colder to the touch than normal. Summary A finger sprain  is a tear or stretch in a ligament in a finger. Ligaments are tissues that connect bones to each other. Finger sprains happen when something makes the bones in the hand move in an abnormal way. They are often caused by a fall or accident. This condition is diagnosed with an exam of your finger. Your health care provider may do an X-ray to see if any bones are broken or dislocated. Treatment for this condition depends on how severe  the sprain is. Treatment may involve buddy taping or wearing a splint. Surgery to reconnect the ligament to a bone may be needed if the ligament was torn all the way. This information is not intended to replace advice given to you by your health care provider. Make sure you discuss any questions you have with your health care provider. Document Revised: 05/23/2020 Document Reviewed: 05/23/2020 Elsevier Patient Education  2024 [employer].

## 2023-01-23 ENCOUNTER — Encounter: Payer: Self-pay | Admitting: Internal Medicine

## 2023-01-28 ENCOUNTER — Telehealth: Payer: Self-pay

## 2023-01-28 NOTE — Telephone Encounter (Signed)
Called and left a VM advising patient to CB to see if appointment can be R/S.

## 2023-01-30 ENCOUNTER — Ambulatory Visit (INDEPENDENT_AMBULATORY_CARE_PROVIDER_SITE_OTHER): Payer: 59 | Admitting: Podiatry

## 2023-01-30 DIAGNOSIS — L600 Ingrowing nail: Secondary | ICD-10-CM | POA: Diagnosis not present

## 2023-01-30 NOTE — Patient Instructions (Signed)

## 2023-02-01 NOTE — Progress Notes (Signed)
Subjective: Chief Complaint  Patient presents with   Nail Problem    Pt states she has been soaking her foot and feels it is getting better and might not have to get the procedure done anymore but she still wants the doctor to check    62 year old female presents for above concerns.  She states that nails no longer hurting there is no swelling, redness or any drainage.  She wants to have the toenail checked however.  No injuries.  No other concerns.   Objective: AAO x3, NAD DP/PT pulses palpable bilaterally, CRT less than 3 seconds Slight incurvation present of the right lateral nail border but appears the nail is growing out.  Nail somewhat dystrophic as well as corner but there is no edema, erythema, drainage or pus or any signs of infection.  There is no pain on exam. No pain with calf compression, swelling, warmth, erythema  Assessment: Right lateral hallux ingrown toenail-improving  Plan: -All treatment options discussed with the patient including all alternatives, risks, complications.  -We discussed pursuing partial nail avulsion however her symptoms have seemed to resolve on her own.  She is happy procedures done previously.  Therefore we held off on this today as it is not infected or causing pain.  I did debride some of the corner distally without any complications or bleeding.  Recommend Epsom salts this next couple days.  However should symptoms recur likely proceed with repeat course nail avulsion with sodium hydroxide.  Vivi Barrack DPM

## 2023-02-04 ENCOUNTER — Ambulatory Visit (INDEPENDENT_AMBULATORY_CARE_PROVIDER_SITE_OTHER): Payer: 59 | Admitting: Orthopedic Surgery

## 2023-02-04 DIAGNOSIS — S6991XA Unspecified injury of right wrist, hand and finger(s), initial encounter: Secondary | ICD-10-CM

## 2023-02-05 ENCOUNTER — Ambulatory Visit (INDEPENDENT_AMBULATORY_CARE_PROVIDER_SITE_OTHER): Payer: 59 | Admitting: Rehabilitative and Restorative Service Providers"

## 2023-02-05 ENCOUNTER — Other Ambulatory Visit: Payer: Self-pay

## 2023-02-05 ENCOUNTER — Encounter: Payer: Self-pay | Admitting: Rehabilitative and Restorative Service Providers"

## 2023-02-05 DIAGNOSIS — M79641 Pain in right hand: Secondary | ICD-10-CM | POA: Diagnosis not present

## 2023-02-05 DIAGNOSIS — R600 Localized edema: Secondary | ICD-10-CM

## 2023-02-05 DIAGNOSIS — R278 Other lack of coordination: Secondary | ICD-10-CM

## 2023-02-05 DIAGNOSIS — M6281 Muscle weakness (generalized): Secondary | ICD-10-CM | POA: Diagnosis not present

## 2023-02-05 DIAGNOSIS — M25641 Stiffness of right hand, not elsewhere classified: Secondary | ICD-10-CM

## 2023-02-05 NOTE — Therapy (Signed)
OUTPATIENT OCCUPATIONAL THERAPY ORTHO EVALUATION  Patient Name: Miranda Guerra MRN: [id number] DOB:12/01/1960, 62 y.o., female Today's Date: 02/05/2023  PCP: Cheryll Cockayne, MD  REFERRING PROVIDER: Cammy Copa, MD   END OF SESSION:  OT End of Session - 02/05/23 1142     Visit Number 1    Number of Visits 8    Date for OT Re-Evaluation 03/20/23    Authorization Type [employer]    OT Start Time 1144    OT Stop Time 1303    OT Time Calculation (min) 79 min    Equipment Utilized During Treatment orthotic materials, finger abductor, anti-swelling sock    Activity Tolerance Patient tolerated treatment well;Patient limited by fatigue;Patient limited by pain;No increased pain    Behavior During Therapy [facility] for tasks assessed/performed             Past Medical History:  Diagnosis Date   Allergic rhinitis    Colon polyps    Hyperlipidemia    Past Surgical History:  Procedure Laterality Date   COLONOSCOPY     CYSTECTOMY     fallopian tube removal     OOPHORECTOMY     POLYPECTOMY     Patient Active Problem List   Diagnosis Date Noted   Injury of ring finger, right, initial encounter 01/22/2023   Abrasion of finger 01/22/2023   Sprain of metacarpophalangeal joint of right ring finger 01/22/2023   Family history of heart disease 07/02/2022   Ingrown toenail 04/28/2022   Agatston coronary artery calcium score is 0,  08/2021 09/09/2021   Diverticulosis of colon 02/18/2021   Family history of colonic polyps 02/18/2021   Dizziness 02/13/2021   Anxiety 11/30/2019   Chronic neck pain 06/18/2018   Hyperlipidemia 09/18/2017   Allergic rhinitis 09/10/2017   Numbness 08/11/2016   Pain 08/11/2016   History of colon cancer 10/01/2012    ONSET DATE: 2 weeks post inury now  01/22/23 DOI  REFERRING DIAG: Y78.29FA (ICD-10-CM) - Injury of finger of right hand, initial encounter   THERAPY DIAG:  Pain in right hand  Muscle weakness (generalized)  Other lack of  coordination  Localized edema  Rationale for Evaluation and Treatment: Rehabilitation  SUBJECTIVE:   SUBJECTIVE STATEMENT: 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  She states walking her dog 2 weeks ago, stumbling and her Rt hand ring finger got jammed into a brick wall as she caught herself. She did not have fractures on x-rays, but has significant swelling, pain, cannot make a fist. Wrist and small finger also painful and stiff. She's had difficulties with pain, work duties, self-care "working out," opening containers, etc. She has been avoiding use and movement of Rt hand digit 4,5 and is guarded from pain today.   PERTINENT HISTORY: Per referral: "Right ring finger to make splint for slip injury "   PRECAUTIONS: None  RED FLAGS: None   WEIGHT BEARING RESTRICTIONS: Yes Restrict weight bearing in Rt hand to <5# for next 3-4 weeks at least   PAIN:  Are you having pain? Yes: NPRS scale: 3/10 at rest in past week up to 4/10 Pain location: Rt RF, ulnar wrist and palm  Pain description: dull, aching, very bad pain if hand is "bumped" Aggravating factors: hitting hand, squeezing into fist Relieving factors: rest  FALLS: Has patient fallen in last 6 months? Yes. Number of falls 1 (this injury)  PLOF: Independent  PATIENT GOALS: to decrease pain, make a fist, return to full life activities  OBJECTIVE: (All objective assessments below are from initial evaluation on: 02/05/23 unless otherwise specified.)   HAND DOMINANCE: Right   ADLs: Overall ADLs: States decreased ability to grab, hold household objects, pain and debility to open containers, perform FMS tasks (manipulate fasteners on clothing), mild to moderate bathing problems as well.   FUNCTIONAL OUTCOME MEASURES: Eval: Patient Specific Functional Scale: 3 / 10 (typing, working out, hiking poles)  (Higher Score  =  Better Ability for the Selected Tasks)     UPPER EXTREMITY ROM     Shoulder to Wrist AROM  Right eval  Forearm supination Approx 80  Forearm pronation  Approx 85  Wrist flexion 72  Wrist extension 51  (Blank rows = not tested)   Hand AROM Right eval  Full Fist Ability (or Gap to Distal Palmar Crease) 6.4 cm gap   Thumb Opposition  (Kapandji Scale)    Ring MCP (0-90) (-18) -  53  Ring PIP (0-100) (-18) -  48  Ring DIP (0-70) 0- 3  Little MCP (0-90) (-6) -  33  Little PIP (0-100) 0 -  38  Little DIP (0-70)  0 - 5  (Blank rows = not tested)   UPPER EXTREMITY MMT:    Eval: Rt wrist grossly 4-/5 and mildly painful against resistance in flex, ext at eval.  RF did have ability to flex and ext at all joints 3-/5MMT roughly (highly limited by pain, swelling, guarding)   HAND FUNCTION: Eval: Observed weakness in affected Rt hand.  Details to be tested when she is less painful. Grip strength Right: TBD lbs, Left: TBD lbs   COORDINATION: Eval: Observed coordination impairments with affected Rt hand, evidenced by poor motion, SF crossing-over the RF, etc.  Details to be tested when she is less painful. Box and Blocks Test: TBD Blocks today (TBD is WFL)  SENSATION: Eval:  Light touch diminished in Rt RF volar and dorsally, though intact temperature sensation as well as pain and pressure sensation.  (Diminished sensation thought to be from swelling and mild neuropraxia from injury.)  EDEMA:   Eval: Moderately swollen in Rt RF today, 7.5cm circumferentially around PIP J  COGNITION: Eval: Overall cognitive status: WFL for evaluation today   OBSERVATIONS:   Eval: Rt SF has inward/radial rotational deformity (1.5cm away from scaphoid orientation when fully flexed, thought to be from abnormal posturing and guarding over time), Ring finger fuisform swelling and tender to touch flexors, volar plate, and and A1 mainly.  Extensors largely non-tender to touch, but stiff. Did show ability to flex and ext at all joints with relative positioning in opposite direction, showing likely no  complete tears/ruptures (she has not had MRI).   Wrist a bit tender and bruised at ulnar styloid but no TFCC tenderness with fovea sign or shear test. Definite guarding and inhibiting pain in RF and SF (PROM much better than AROM in SF)   In summation: She presents as a strain to the FDP or FDS of the ring finger as well as possible strained A1 ligament and volar plate.  Rotational deformity of the small finger thought to be from guarding and poor posturing.  Wrist is also a bit strained around the ulnar styloid.  TODAY'S TREATMENT:  Post-evaluation treatment:  First, she was given self-care education to allow hand to rest, to not lift push or pull anything more than 5 pounds with the right hand, or do anything overly painful.  She was given a compressive finger sock to help with  swelling through the ring finger.  Due to pain posturing, inhibiting pain, and pain if she moves or hits her hand, orthotic fabrication was indicated to help her rest/immobilize the ring and small fingers.   Custom orthotic fabrication was indicated due to pt's right hand ring finger injury and need for safe, functional positioning. OT fabricated custom hand-based, safe position orthosis including the fourth and fifth digits to prevent strain through the flexor groups and also prevent any formation of pseudo boutonniere injury. It fit well with no areas of pressure, pt states a comfortable fit. Pt was educated on the wearing schedule (on at all times except exercises and hygiene for now), to call or come in ASAP if it is causing any irritation or is not achieving desired function. It will be checked/adjusted in upcoming sessions, as needed. Pt states understanding.   Additionally she was given the following home exercise program to complete 4-6 times a day, to perform without any additional pain, and OT demonstrates these and she performs them back for accuracy and understanding:  Exercises - Bend and Pull Back Wrist SLOWLY  -  4 x daily - 10-15 reps - Wrist Flexion Stretch  - 4 x daily - 3-5 reps - 15 sec hold - Wrist Extension Stretch Pronated  - 4 x daily - 3-5 reps - 15 hold - Finger Spreading  - 4-6 x daily - 10-15 reps - Tendon Glides  - 4-6 x daily - 3-5 reps - 2-3 seconds hold   She has no significant pain when performing these and states understanding how to do them at the end of the session.    PATIENT EDUCATION: Education details: See tx section above for details  Person educated: Patient Education method: Verbal Instruction, Teach back, Handouts  Education comprehension: States and demonstrates understanding, Additional Education required    HOME EXERCISE PROGRAM: Access Code: [email or web address] URL: [email or web address] Date: 02/05/2023 Prepared by: Fannie Knee   GOALS: Goals reviewed with patient? Yes   SHORT TERM GOALS: (STG required if POC>30 days) Target Date: 02/20/23  Pt will obtain protective, custom orthosis. Goal status: 02/05/23:  MET   2.  Pt will demo/state understanding of initial HEP to improve pain levels and prerequisite motion. Goal status: INITIAL   LONG TERM GOALS: Target Date: 03/20/23  Pt will improve functional ability by decreased impairment per PSFS assessment from 3 / 10 to  7/ 10 or better, for better quality of life. Goal status: INITIAL  2.  Pt will improve grip strength in Rt hand from painful, unsafe to test to at least 45lbs for functional use at home and in IADLs. Goal status: INITIAL  3.  Pt will improve A/ROM in Rt hand RF and SF total active motion from 68* and 70*  to at least 220* in each, to have functional motion for tasks like reach and grasp.  Goal status: INITIAL  4.  Pt will improve strength in Rt wrist from 4-/5 pain MMT to at least 4+/5 MMT to have increased functional ability to carry out selfcare and higher-level homecare tasks with no difficulty. Goal status: INITIAL  5.  Pt will decrease pain at rest from 3/10 to 1/10 or  better to have better sleep and occupational participation in daily roles. Goal status: INITIAL   ASSESSMENT:  CLINICAL IMPRESSION: Patient is a 62 y.o. female who was seen today for occupational therapy evaluation for Rt hand injury which presents as a strain to the FDP or FDS of the ring finger as  well as possible strained A1 ligament and volar plate with accompanying stiffness and rotational deformity of the small finger.  She will benefit from outpatient occupational therapy to decrease pain, increase function and quality of life.  PERFORMANCE DEFICITS: in functional skills including ADLs, IADLs, coordination, dexterity, edema, ROM, strength, pain, fascial restrictions, flexibility, Fine motor control, Gross motor control, body mechanics, endurance, decreased knowledge of precautions, and UE functional use, cognitive skills including problem solving and safety awareness, and psychosocial skills including coping strategies and environmental adaptation.   IMPAIRMENTS: are limiting patient from ADLs, IADLs, work, leisure, and social participation.   COMORBIDITIES: may have co-morbidities  that affects occupational performance. Patient will benefit from skilled OT to address above impairments and improve overall function.  MODIFICATION OR ASSISTANCE TO COMPLETE EVALUATION: Min-Moderate modification of tasks or assist with assess necessary to complete an evaluation.  OT OCCUPATIONAL PROFILE AND HISTORY: Problem focused assessment: Including review of records relating to presenting problem.  CLINICAL DECISION MAKING: Moderate - several treatment options, min-mod task modification necessary  REHAB POTENTIAL: Excellent  EVALUATION COMPLEXITY: Low      PLAN:  OT FREQUENCY: 1-2x/week  OT DURATION: 6 weeks (up to 8 total visits through 03/20/23, as needed)   PLANNED INTERVENTIONS: self care/ADL training, therapeutic exercise, therapeutic activity, neuromuscular re-education, manual therapy,  passive range of motion, splinting, electrical stimulation, ultrasound, fluidotherapy, compression bandaging, moist heat, cryotherapy, contrast bath, patient/family education, energy conservation, coping strategies training, DME and/or AE instructions, Re-evaluation, and Dry needling  RECOMMENDED OTHER SERVICES: none now   CONSULTED AND AGREED WITH PLAN OF CARE: Patient  PLAN FOR NEXT SESSION:   Check orthosis for fit and use, when less swollen and tender start weaning from this.  Check home exercises and upgrade to light tolerable stretches when nonpainful and well-tolerated.  Withhold grip training and resistance to the hand for at least 2 to 3 weeks recommended.   Fannie Knee, OTR/L 02/05/2023, 2:15 PM

## 2023-02-06 ENCOUNTER — Encounter: Payer: Self-pay | Admitting: Orthopedic Surgery

## 2023-02-06 NOTE — Progress Notes (Signed)
Office Visit Note   Patient: Miranda Guerra           Date of Birth: 12/10/1960           MRN: [id number] Visit Date: 02/04/2023 Requested by: Etta Grandchild, MD [address] [place],  [place] [address] PCP: Pincus Sanes, MD  Subjective: Chief Complaint  Patient presents with   Other     Finger injury    HPI: Miranda Guerra is a 62 y.o. female who presents to the office reporting right hand and finger pain.  She injured her right ring finger 2 weeks ago while walking her dog.  She tripped and her finger jammed into a brick.  Axial loading type injury affecting the right ring finger on her dominant right hand.  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  Took ibuprofen initially but none since the week of injury.  Localizes most of her pain to the PIP joint..                ROS: All systems reviewed are negative as they relate to the chief complaint within the history of present illness.  Patient denies fevers or chills.  Assessment & Plan: Visit Diagnoses:  1. Injury of finger of right hand, initial encounter     Plan: Impression is right hand PIP pain with possible central slip injury.  She does lack about 10 degrees of full active extension.  Has a lot of tightness of the DIP joint with passive motion with the PIP fully extended.  Plan is ring splint and follow-up with # in 2 weeks for further evaluation and management.  Follow-Up Instructions: No follow-ups on file.   Orders:  Orders Placed This Encounter  Procedures   Ambulatory referral to Occupational Therapy   Ambulatory referral to Orthopedic Surgery   No orders of the defined types were placed in this encounter.     Procedures: No procedures performed   Clinical Data: No additional findings.  Objective: Vital Signs: LMP 12/12/2012   Physical Exam:  Constitutional: Patient appears well-developed HEENT:  Head: Normocephalic Eyes:EOM are normal Neck: Normal range of  motion Cardiovascular: Normal rate Pulmonary/chest: Effort normal Neurologic: Patient is alert Skin: Skin is warm Psychiatric: Patient has normal mood and affect  Ortho Exam: Ortho exam demonstrates some bruising and ecchymosis around the right ring finger.  Has a little ecchymosis and bruising around the right third finger as well around the PIP joint.  Collateral ligaments are stable at the PIP joint.  She lacks about 10 degrees of full extension actively at that joint on the right with full active extension on the left at the PIP joint.  Does have discrete and significant tenderness around the dorsal PIP joint.  PIP joint is tight with full DIP extension.  Tendon remains central over the MCP joint on the extensor side.  Flexor tendons are functional and intact.  Specialty Comments:  No specialty comments available.  Imaging: No results found.   PMFS History: Patient Active Problem List   Diagnosis Date Noted   Injury of ring finger, right, initial encounter 01/22/2023   Abrasion of finger 01/22/2023   Sprain of metacarpophalangeal joint of right ring finger 01/22/2023   Family history of heart disease 07/02/2022   Ingrown toenail 04/28/2022   Agatston coronary artery calcium score is 0,  08/2021 09/09/2021   Diverticulosis of colon 02/18/2021   Family history of colonic polyps 02/18/2021  Dizziness 02/13/2021   Anxiety 11/30/2019   Chronic neck pain 06/18/2018   Hyperlipidemia 09/18/2017   Allergic rhinitis 09/10/2017   Numbness 08/11/2016   Pain 08/11/2016   History of colon cancer 10/01/2012   Past Medical History:  Diagnosis Date   Allergic rhinitis    Colon polyps    Hyperlipidemia     Family History  Problem Relation Age of Onset   Alzheimer's disease Mother    Hypertension Mother    Transient ischemic attack Mother    Stroke Mother    Heart disease Maternal Grandfather    Emphysema Maternal Grandfather    Hyperlipidemia Maternal Grandfather    Hypertension  Maternal Grandfather    Lung cancer Paternal Grandmother    Emphysema Paternal Grandfather    Heart disease Paternal Grandfather    Colon polyps Father    Hyperlipidemia Father    Hearing loss Father    Hypertension Maternal Grandmother    Breast cancer Sister    Breast cancer Maternal Aunt    Breast cancer Sister    Colon cancer Neg Hx     Past Surgical History:  Procedure Laterality Date   COLONOSCOPY     CYSTECTOMY     fallopian tube removal     OOPHORECTOMY     POLYPECTOMY     Social History   Occupational History   Not on file  Tobacco Use   Smoking status: Former    Current packs/day: 0.00    Types: Cigarettes    Quit date: 07/15/1987    Years since quitting: 35.5   Smokeless tobacco: Never  Substance and Sexual Activity   Alcohol use: Yes    Alcohol/week: 4.0 standard drinks of alcohol    Types: 4 Glasses of wine per week   Drug use: No   Sexual activity: Never

## 2023-02-10 ENCOUNTER — Ambulatory Visit: Payer: 59 | Admitting: Podiatry

## 2023-02-12 NOTE — Therapy (Signed)
OUTPATIENT OCCUPATIONAL THERAPY TREATMENT NOTE   Patient Name: Miranda Guerra MRN: [id number] DOB:01/11/1961, 62 y.o., female Today's Date: 02/13/2023  PCP: Cheryll Cockayne, MD  REFERRING PROVIDER: Cammy Copa, MD   END OF SESSION:  OT End of Session - 02/13/23 0935     Visit Number 2    Number of Visits 8    Date for OT Re-Evaluation 03/20/23    Authorization Type [employer]    OT Start Time [phone]    OT Stop Time 1014    OT Time Calculation (min) 38 min    Equipment Utilized During Treatment coban    Activity Tolerance Patient tolerated treatment well;Patient limited by fatigue;Patient limited by pain;No increased pain    Behavior During Therapy [facility] for tasks assessed/performed              Past Medical History:  Diagnosis Date   Allergic rhinitis    Colon polyps    Hyperlipidemia    Past Surgical History:  Procedure Laterality Date   COLONOSCOPY     CYSTECTOMY     fallopian tube removal     OOPHORECTOMY     POLYPECTOMY     Patient Active Problem List   Diagnosis Date Noted   Injury of ring finger, right, initial encounter 01/22/2023   Abrasion of finger 01/22/2023   Sprain of metacarpophalangeal joint of right ring finger 01/22/2023   Family history of heart disease 07/02/2022   Ingrown toenail 04/28/2022   Agatston coronary artery calcium score is 0,  08/2021 09/09/2021   Diverticulosis of colon 02/18/2021   Family history of colonic polyps 02/18/2021   Dizziness 02/13/2021   Anxiety 11/30/2019   Chronic neck pain 06/18/2018   Hyperlipidemia 09/18/2017   Allergic rhinitis 09/10/2017   Numbness 08/11/2016   Pain 08/11/2016   History of colon cancer 10/01/2012    ONSET DATE:  01/22/23 DOI  REFERRING DIAG: Q46.96EX (ICD-10-CM) - Injury of finger of right hand, initial encounter   THERAPY DIAG:  Pain in right hand  Muscle weakness (generalized)  Other lack of coordination  Localized edema  Stiffness of right hand, not elsewhere  classified  Rationale for Evaluation and Treatment: Rehabilitation  PERTINENT HISTORY: Per referral: "Right ring finger to make splint for slip injury "  She states working for [employer] in [employer].  She states walking her dog 2 weeks ago, stumbling and her Rt hand ring finger got jammed into a brick wall as she caught herself. She did not have fractures on x-rays, but has significant swelling, pain, cannot make a fist. Wrist and small finger also painful and stiff. She's had difficulties with pain, work duties, self-care "working out," opening containers, etc. She has been avoiding use and movement of Rt hand digit 4,5 and is guarded from pain today.   PRECAUTIONS: None  RED FLAGS: None   WEIGHT BEARING RESTRICTIONS: Yes Restrict weight bearing in Rt hand to <5# for next 3-4 weeks at least    SUBJECTIVE:   SUBJECTIVE STATEMENT: 3 weeks post inury now. She states that she was able to wear the orthosis and it was not uncomfortable.  She does state that some exercises made her feel sore but this resolved.  She has no pain today now.    PAIN:  Are you having pain?  Yes: NPRS scale: 0/10 at rest Pain location: Rt RF, ulnar wrist and palm  Pain description: dull, aching, very bad pain if hand is "bumped" Aggravating factors: hitting hand, squeezing into  fist Relieving factors: rest  PATIENT GOALS: to decrease pain, make a fist, return to full life activities    OBJECTIVE: (All objective assessments below are from initial evaluation on: 02/05/23 unless otherwise specified.)   HAND DOMINANCE: Right   ADLs: Overall ADLs: States decreased ability to grab, hold household objects, pain and debility to open containers, perform FMS tasks (manipulate fasteners on clothing), mild to moderate bathing problems as well.   FUNCTIONAL OUTCOME MEASURES: Eval: Patient Specific Functional Scale: 3 / 10 (typing, working out, hiking poles)  (Higher Score  =  Better Ability for the Selected  Tasks)     UPPER EXTREMITY ROM     Shoulder to Wrist AROM Right eval Rt 02/13/23  Forearm supination Approx 80   Forearm pronation  Approx 85   Wrist flexion 72 70  Wrist extension 51 62  (Blank rows = not tested)   Hand AROM Right eval Rt 02/13/23  Full Fist Ability (or Gap to Distal Palmar Crease) 6.4 cm gap  4.1cm gap  Thumb Opposition  (Kapandji Scale)     Ring MCP (0-90) (-18) -  53 (-18) - 60  Ring PIP (0-100) (-18) -  48 (-5) - 81  Ring DIP (0-70) 0- 3 (-2) - 47  Little MCP (0-90) (-6) -  33   Little PIP (0-100) 0 -  38   Little DIP (0-70)  0 - 5   (Blank rows = not tested)   UPPER EXTREMITY MMT:    Eval: Rt wrist grossly 4-/5 and mildly painful against resistance in flex, ext at eval.  RF did have ability to flex and ext at all joints 3-/5MMT roughly (highly limited by pain, swelling, guarding)   HAND FUNCTION: Eval: Observed weakness in affected Rt hand.  Details to be tested when she is less painful. Grip strength Right: TBD lbs, Left: TBD lbs   COORDINATION: Eval: Observed coordination impairments with affected Rt hand, evidenced by poor motion, SF crossing-over the RF, etc.  Details to be tested when she is less painful. Box and Blocks Test: TBD Blocks today (TBD is WFL)  SENSATION: Eval:  Light touch diminished in Rt RF volar and dorsally, though intact temperature sensation as well as pain and pressure sensation.  (Diminished sensation thought to be from swelling and mild neuropraxia from injury.)  EDEMA:   Eval: Moderately swollen in Rt RF today, 7.5cm circumferentially around PIP J  OBSERVATIONS:   Eval: Rt SF has inward/radial rotational deformity (1.5cm away from scaphoid orientation when fully flexed, thought to be from abnormal posturing and guarding over time), Ring finger fuisform swelling and tender to touch flexors, volar plate, and and A1 mainly.  Extensors largely non-tender to touch, but stiff. Did show ability to flex and ext at all joints with  relative positioning in opposite direction, showing likely no complete tears/ruptures (she has not had MRI).   Wrist a bit tender and bruised at ulnar styloid but no TFCC tenderness with fovea sign or shear test. Definite guarding and inhibiting pain in RF and SF (PROM much better than AROM in SF)   In summation: She presents as a strain to the FDP or FDS of the ring finger as well as possible strained A1 ligament and volar plate.  Rotational deformity of the small finger thought to be from guarding and poor posturing.  Wrist is also a bit strained around the ulnar styloid.  TODAY'S TREATMENT:  02/13/23: She performs active range of motion for exercises as well as  new motion measures which shows excellent progress since last seen.  Her swelling is visibly down thanks to the compressive wrap that she was given, but this is soiled and she requests a new one.  OT has no more but instead supplies her with a Coban that she can wrap for edema reduction and she is shown how to do this carefully to avoid occluding perfusion.  OT then does IASTM manual therapy myofascial release all around the small finger and ring fingers going into the MCP joints in the palm a bit.  This is nonpainful to her and she states it feels much better like a stretch sensation.  OT also reviews her HEP with her carefully and has her perform back for understanding.  She does well with this today and seems to be mastering it.  She leaves in no pain, states feeling better.  Exercises - Bend and Pull Back Wrist SLOWLY  - 4 x daily - 10-15 reps - Wrist Flexion Stretch  - 4 x daily - 3-5 reps - 15 sec hold - Wrist Extension Stretch Pronated  - 4 x daily - 3-5 reps - 15 hold - Finger Spreading  - 4-6 x daily - 10-15 reps - Tendon Glides  - 4-6 x daily - 3-5 reps - 2-3 seconds hold    PATIENT EDUCATION: Education details: See tx section above for details  Person educated: Patient Education method: Verbal Instruction, Teach back, Handouts   Education comprehension: States and demonstrates understanding, Additional Education required    HOME EXERCISE PROGRAM: Access Code: [email or web address] URL: [email or web address]   GOALS: Goals reviewed with patient? Yes   SHORT TERM GOALS: (STG required if POC>30 days) Target Date: 02/20/23  Pt will obtain protective, custom orthosis. Goal status: 02/05/23:  MET   2.  Pt will demo/state understanding of initial HEP to improve pain levels and prerequisite motion. Goal status:02/13/23: MET   LONG TERM GOALS: Target Date: 03/20/23  Pt will improve functional ability by decreased impairment per PSFS assessment from 3 / 10 to  7/ 10 or better, for better quality of life. Goal status: INITIAL  2.  Pt will improve grip strength in Rt hand from painful, unsafe to test to at least 45lbs for functional use at home and in IADLs. Goal status: INITIAL  3.  Pt will improve A/ROM in Rt hand RF and SF total active motion from 68* and 70*  to at least 220* in each, to have functional motion for tasks like reach and grasp.  Goal status: INITIAL  4.  Pt will improve strength in Rt wrist from 4-/5 pain MMT to at least 4+/5 MMT to have increased functional ability to carry out selfcare and higher-level homecare tasks with no difficulty. Goal status: INITIAL  5.  Pt will decrease pain at rest from 3/10 to 1/10 or better to have better sleep and occupational participation in daily roles. Goal status: INITIAL   ASSESSMENT:  CLINICAL IMPRESSION: 02/13/23: She has 80 degrees range of motion gain compared to her first visit last week, which is amazing.  She likely can start to wean from orthosis more so next week if pain remains low.  We will have to be careful to avoid exacerbation so she may need some form of "stepdown" bracing.  Light gripping and pinching can also be introduced in the next 1 to 2 weeks as long as nonpainful.  We will also consider strengthening for the wrist and  forearm.   Eval: Patient is a 62  y.o. female who was seen today for occupational therapy evaluation for Rt hand injury which presents as a strain to the FDP or FDS of the ring finger as well as possible strained A1 ligament and volar plate with accompanying stiffness and rotational deformity of the small finger.  She will benefit from outpatient occupational therapy to decrease pain, increase function and quality of life.    PLAN:  OT FREQUENCY: 1-2x/week  OT DURATION: 6 weeks (up to 8 total visits through 03/20/23, as needed)   PLANNED INTERVENTIONS: self care/ADL training, therapeutic exercise, therapeutic activity, neuromuscular re-education, manual therapy, passive range of motion, splinting, electrical stimulation, ultrasound, fluidotherapy, compression bandaging, moist heat, cryotherapy, contrast bath, patient/family education, energy conservation, coping strategies training, DME and/or AE instructions, Re-evaluation, and Dry needling  CONSULTED AND AGREED WITH PLAN OF CARE: Patient  PLAN FOR NEXT SESSION:   Continue to monitor motion, can consider starting to wean orthosis and adding light strength training either at the wrist/forearm or hand.  Consider stepdown brace when ready as well.   Fannie Knee, OTR/L 02/13/2023, 12:11 PM

## 2023-02-13 ENCOUNTER — Encounter: Payer: Self-pay | Admitting: Rehabilitative and Restorative Service Providers"

## 2023-02-13 ENCOUNTER — Ambulatory Visit (INDEPENDENT_AMBULATORY_CARE_PROVIDER_SITE_OTHER): Payer: 59 | Admitting: Rehabilitative and Restorative Service Providers"

## 2023-02-13 DIAGNOSIS — R600 Localized edema: Secondary | ICD-10-CM

## 2023-02-13 DIAGNOSIS — M6281 Muscle weakness (generalized): Secondary | ICD-10-CM | POA: Diagnosis not present

## 2023-02-13 DIAGNOSIS — M79641 Pain in right hand: Secondary | ICD-10-CM | POA: Diagnosis not present

## 2023-02-13 DIAGNOSIS — R278 Other lack of coordination: Secondary | ICD-10-CM

## 2023-02-13 DIAGNOSIS — M25641 Stiffness of right hand, not elsewhere classified: Secondary | ICD-10-CM

## 2023-02-17 NOTE — Therapy (Addendum)
 OUTPATIENT OCCUPATIONAL THERAPY TREATMENT & DISCHARGE NOTE   Patient Name: Miranda Guerra MRN: [id number] DOB:1960/10/31, 62 y.o., female Today's Date: 02/19/2023  PCP: Geofm Hastings, MD  REFERRING PROVIDER: Addie Cordella Hamilton, MD               OCCUPATIONAL THERAPY DISCHARGE SUMMARY  Visits from Start of Care: 3  Pt did not return to therapy, so goals could not be addressed.   Pt now officially discharged form OP OT.  See note below for additional details.   Melvenia Ada, OTR/L, CHT 03/10/24            END OF SESSION:  OT End of Session - 02/19/23 1103     Visit Number 3    Number of Visits 8    Date for OT Re-Evaluation 03/20/23    Authorization Type [employer]    OT Start Time 1027    OT Stop Time 1103    OT Time Calculation (min) 36 min    Equipment Utilized During Treatment --    Activity Tolerance Patient tolerated treatment well;Patient limited by fatigue;No increased pain    Behavior During Therapy [facility] for tasks assessed/performed             Past Medical History:  Diagnosis Date   Allergic rhinitis    Colon polyps    Hyperlipidemia    Past Surgical History:  Procedure Laterality Date   COLONOSCOPY     CYSTECTOMY     fallopian tube removal     OOPHORECTOMY     POLYPECTOMY     Patient Active Problem List   Diagnosis Date Noted   Injury of ring finger, right, initial encounter 01/22/2023   Abrasion of finger 01/22/2023   Sprain of metacarpophalangeal joint of right ring finger 01/22/2023   Family history of heart disease 07/02/2022   Ingrown toenail 04/28/2022   Agatston coronary artery calcium  score is 0,  08/2021 09/09/2021   Diverticulosis of colon 02/18/2021   Family history of colonic polyps 02/18/2021   Dizziness 02/13/2021   Anxiety 11/30/2019   Chronic neck pain 06/18/2018   Hyperlipidemia 09/18/2017   Allergic rhinitis 09/10/2017   Numbness 08/11/2016   Pain 08/11/2016   History of colon cancer 10/01/2012    ONSET  DATE:  01/22/23 DOI  REFERRING DIAG: D30.08KJ (ICD-10-CM) - Injury of finger of right hand, initial encounter   THERAPY DIAG:  Pain in right hand  Muscle weakness (generalized)  Other lack of coordination  Localized edema  Stiffness of right hand, not elsewhere classified  Rationale for Evaluation and Treatment: Rehabilitation  PERTINENT HISTORY: Per referral: Right ring finger to make splint for slip injury   She states working for [employer] in [employer].  She states walking her dog 2 weeks ago, stumbling and her Rt hand ring finger got jammed into a brick wall as she caught herself. She did not have fractures on x-rays, but has significant swelling, pain, cannot make a fist. Wrist and small finger also painful and stiff. She's had difficulties with pain, work duties, self-care working out, opening containers, etc. She has been avoiding use and movement of Rt hand digit 4,5 and is guarded from pain today.   PRECAUTIONS: None  RED FLAGS: None   WEIGHT BEARING RESTRICTIONS: Yes Restrict weight bearing in Rt hand to <5# for next 3-4 weeks at least    SUBJECTIVE:   SUBJECTIVE STATEMENT: 4 weeks post inury now. She states that her hand feels like it continues to  improve in terms of motion and strength as well as pain.  She wonders if she can start leaving her orthosis off soon.   PAIN:  Are you having pain?  NO  0/10 at rest Pain location: Rt RF, ulnar wrist and palm  Pain description: dull, aching, very bad pain if hand is bumped Aggravating factors: hitting hand, squeezing into fist Relieving factors: rest  PATIENT GOALS: to decrease pain, make a fist, return to full life activities    OBJECTIVE: (All objective assessments below are from initial evaluation on: 02/05/23 unless otherwise specified.)   HAND DOMINANCE: Right   ADLs: Overall ADLs: States decreased ability to grab, hold household objects, pain and debility to open containers, perform FMS tasks  (manipulate fasteners on clothing), mild to moderate bathing problems as well.   FUNCTIONAL OUTCOME MEASURES: Eval: Patient Specific Functional Scale: 3 / 10 (typing, working out, hiking poles)  (Higher Score  =  Better Ability for the Selected Tasks)     UPPER EXTREMITY ROM     Shoulder to Wrist AROM Right eval Rt 02/13/23 Rt 02/19/23  Forearm supination Approx 80    Forearm pronation  Approx 85    Wrist flexion 72 70 70  Wrist extension 51 62 66  (Blank rows = not tested)   Hand AROM Right eval Rt 02/13/23 Rt 02/19/23  Full Fist Ability (or Gap to Distal Palmar Crease) 6.4 cm gap  4.1cm gap 3.2cm gap  Thumb Opposition  (Kapandji Scale)    10  Ring MCP (0-90) (-18) -  53 (-18) - 60 0 - 55  Ring PIP (0-100) (-18) -  48 (-5) - 81 0 - 85  Ring DIP (0-70) 0- 3 (-2) - 47 0 - 48  Little MCP (0-90) (-6) -  33  0 - 62  Little PIP (0-100) 0 -  38  0 - 102  Little DIP (0-70)  0 - 5  0 - 44  (Blank rows = not tested)   UPPER EXTREMITY MMT:    Eval: Rt wrist grossly 4-/5 and mildly painful against resistance in flex, ext at eval.  RF did have ability to flex and ext at all joints 3-/5MMT roughly (highly limited by pain, swelling, guarding)   HAND FUNCTION: Eval: Observed weakness in affected Rt hand.  Details to be tested when she is less painful. Grip strength Right: TBD lbs, Left: TBD lbs   COORDINATION: Eval: Observed coordination impairments with affected Rt hand, evidenced by poor motion, SF crossing-over the RF, etc.  Details to be tested when she is less painful. Box and Blocks Test: TBD Blocks today (TBD is WFL)  SENSATION: Eval:  Light touch diminished in Rt RF volar and dorsally, though intact temperature sensation as well as pain and pressure sensation.  (Diminished sensation thought to be from swelling and mild neuropraxia from injury.)  EDEMA:   Eval: Moderately swollen in Rt RF today, 7.5cm circumferentially around PIP J  OBSERVATIONS:   Eval: Rt SF has inward/radial  rotational deformity (1.5cm away from scaphoid orientation when fully flexed, thought to be from abnormal posturing and guarding over time), Ring finger fuisform swelling and tender to touch flexors, volar plate, and and A1 mainly.  Extensors largely non-tender to touch, but stiff. Did show ability to flex and ext at all joints with relative positioning in opposite direction, showing likely no complete tears/ruptures (she has not had MRI).   Wrist a bit tender and bruised at ulnar styloid but no TFCC tenderness  with fovea sign or shear test. Definite guarding and inhibiting pain in RF and SF (PROM much better than AROM in SF)   In summation: She presents as a strain to the FDP or FDS of the ring finger as well as possible strained A1 ligament and volar plate.  Rotational deformity of the small finger thought to be from guarding and poor posturing.  Wrist is also a bit strained around the ulnar styloid.  TODAY'S TREATMENT:  02/19/23: She starts with active range of motion for exercise as well as a check of healing and status-this shows improvements at both the wrist and hand again.  Her MCP joints are the most stiff out of the hole today, so her home exercises were adapted to include new MCP joint flexion stretches as tolerated.  She does well with these today not having any pain but feeling a stretch.  Next she was able to upgrade her tendon glides to include full tendon gliding into full fist.  Her other exercises including finger abduction and wrist stretches were reviewed as well.  Next, she was educated on weaning procedures of her orthosis to decrease stiffness and increased functional ability slowly.  She was advised to take off her brace 3-4 times a day for periods of 15 to 30 minutes when just at rest.  She should still wear her orthosis in the night.  Over the next 1 to 3 weeks she was educated to start leaving the orthosis off more and more over her greater periods of time progressively but only as  tolerated in terms of pain and motion.  She states understanding  Exercises - Bend and Pull Back Wrist SLOWLY  - 4 x daily - 10-15 reps - Wrist Flexion Stretch  - 4 x daily - 3-5 reps - 15 sec hold - Wrist Extension Stretch Pronated  - 4 x daily - 3-5 reps - 15 hold - Finger Spreading  - 4-6 x daily - 10-15 reps - Tendon Glides  - 4-6 x daily - 3-5 reps - 2-3 seconds hold - BACK KNUCKLE STRETCHES   - 4 x daily - 3-5 reps - 15 sec hold   PATIENT EDUCATION: Education details: See tx section above for details  Person educated: Patient Education method: Verbal Instruction, Teach back, Handouts  Education comprehension: States and demonstrates understanding, Additional Education required    HOME EXERCISE PROGRAM: Access Code: [email or web address] URL: [email or web address]   GOALS: Goals reviewed with patient? Yes   SHORT TERM GOALS: (STG required if POC>30 days) Target Date: 02/20/23  Pt will obtain protective, custom orthosis. Goal status: 02/05/23:  MET   2.  Pt will demo/state understanding of initial HEP to improve pain levels and prerequisite motion. Goal status:02/13/23: MET   LONG TERM GOALS: Target Date: 03/20/23  Pt will improve functional ability by decreased impairment per PSFS assessment from 3 / 10 to  7/ 10 or better, for better quality of life. Goal status: INITIAL  2.  Pt will improve grip strength in Rt hand from painful, unsafe to test to at least 45lbs for functional use at home and in IADLs. Goal status: INITIAL  3.  Pt will improve A/ROM in Rt hand RF and SF total active motion from 68* and 70*  to at least 220* in each, to have functional motion for tasks like reach and grasp.  Goal status: INITIAL  4.  Pt will improve strength in Rt wrist from 4-/5 pain MMT to at least 4+/5 MMT to have increased  functional ability to carry out selfcare and higher-level homecare tasks with no difficulty. Goal status: INITIAL  5.  Pt will decrease pain at rest from 3/10  to 1/10 or better to have better sleep and occupational participation in daily roles. Goal status: INITIAL   ASSESSMENT:  CLINICAL IMPRESSION: 02/19/23: She still has mild crossing over of the small finger which is somewhat improving and her small finger motion is greatly improved since the start of care.  Her ring finger motion is also greatly improved but MCP joints were most stiff so that was focused on today.  She will also begin weaning from her orthosis this week and hopefully this will not increase pain or symptoms again.  Still, she presents as a strain to the A1 pulley group or possible volar plate.  Continue on    PLAN:  OT FREQUENCY: 1-2x/week  OT DURATION: 6 weeks (up to 8 total visits through 03/20/23, as needed)   PLANNED INTERVENTIONS: self care/ADL training, therapeutic exercise, therapeutic activity, neuromuscular re-education, manual therapy, passive range of motion, splinting, electrical stimulation, ultrasound, fluidotherapy, compression bandaging, moist heat, cryotherapy, contrast bath, patient/family education, energy conservation, coping strategies training, DME and/or AE instructions, Re-evaluation, and Dry needling  CONSULTED AND AGREED WITH PLAN OF CARE: Patient  PLAN FOR NEXT SESSION:   Check new MCP J stretch (with anticrossover at small finger), consider adding IP J stretches and hand strength isometrically and lightly with towel roll squeezes and some finger extension strength all as tolerated.  Watch for exacerbations   Melvenia Ada, OTR/L 02/19/2023, 5:22 PM

## 2023-02-19 ENCOUNTER — Ambulatory Visit (INDEPENDENT_AMBULATORY_CARE_PROVIDER_SITE_OTHER): Payer: 59 | Admitting: Rehabilitative and Restorative Service Providers"

## 2023-02-19 ENCOUNTER — Encounter: Payer: Self-pay | Admitting: Rehabilitative and Restorative Service Providers"

## 2023-02-19 DIAGNOSIS — M79641 Pain in right hand: Secondary | ICD-10-CM | POA: Diagnosis not present

## 2023-02-19 DIAGNOSIS — M6281 Muscle weakness (generalized): Secondary | ICD-10-CM | POA: Diagnosis not present

## 2023-02-19 DIAGNOSIS — R278 Other lack of coordination: Secondary | ICD-10-CM

## 2023-02-19 DIAGNOSIS — R600 Localized edema: Secondary | ICD-10-CM

## 2023-02-19 DIAGNOSIS — M25641 Stiffness of right hand, not elsewhere classified: Secondary | ICD-10-CM

## 2023-02-26 ENCOUNTER — Encounter: Payer: 59 | Admitting: Rehabilitative and Restorative Service Providers"

## 2023-03-18 ENCOUNTER — Encounter: Payer: 59 | Admitting: Rehabilitative and Restorative Service Providers"

## 2023-03-20 ENCOUNTER — Other Ambulatory Visit: Payer: Self-pay

## 2023-03-20 ENCOUNTER — Telehealth: Payer: Self-pay | Admitting: Internal Medicine

## 2023-03-20 MED ORDER — ALLEGRA-D ALLERGY & CONGESTION 60-120 MG PO TB12: 1.0000 | ORAL_TABLET | Freq: Two times a day (BID) | ORAL | 1 refills | Status: DC

## 2023-03-20 NOTE — Telephone Encounter (Signed)
Script sent in today. 

## 2023-03-20 NOTE — Telephone Encounter (Signed)
Prescription Request  03/20/2023  LOV: 07/02/2022  What is the name of the medication or equipment? Allegra D Allergy and congestion 12 hour tablets  Have you contacted your pharmacy to request a refill? Yes   Which pharmacy would you like this sent to?  [id number] [place], [place] - [address] [place] [address] Phone: [phone] Fax: [phone]    Patient notified that their request is being sent to the clinical staff for review and that they should receive a response within 2 business days.   Please advise at Mobile [phone] (mobile)

## 2023-07-18 ENCOUNTER — Other Ambulatory Visit: Payer: Self-pay | Admitting: Internal Medicine

## 2023-07-24 ENCOUNTER — Telehealth: Payer: Self-pay | Admitting: Internal Medicine

## 2023-07-24 DIAGNOSIS — R739 Hyperglycemia, unspecified: Secondary | ICD-10-CM | POA: Insufficient documentation

## 2023-07-24 DIAGNOSIS — R7303 Prediabetes: Secondary | ICD-10-CM | POA: Insufficient documentation

## 2023-07-24 DIAGNOSIS — E782 Mixed hyperlipidemia: Secondary | ICD-10-CM

## 2023-07-24 NOTE — Addendum Note (Signed)
 Addended by: Pincus Sanes on: 07/24/2023 12:26 PM   Modules accepted: Orders

## 2023-07-24 NOTE — Telephone Encounter (Signed)
 Pt has CPE 1/15 and has been scheduled for labs 1/13.  Please enter lab orders.

## 2023-07-26 ENCOUNTER — Encounter: Payer: Self-pay | Admitting: Internal Medicine

## 2023-07-26 NOTE — Progress Notes (Signed)
 Subjective:    Patient ID: Miranda Guerra, female    DOB: [birth year], 63 y.o.   MRN: [id number]      HPI Miranda Guerra is here for a Physical exam and her chronic medical problems.   Doing well overall.  Has been trying to get back to her old self-has been exercising more and is trying to eat healthier.  Overall feels like she is doing higher.   Medications and allergies reviewed with patient and updated if appropriate.  Current Outpatient Medications on File Prior to Visit  Medication Sig Dispense Refill   Cholecalciferol (VITAMIN D) 50 MCG (2000 UT) CAPS Take by mouth.     CLENPIQ 10-3.5-12 MG-GM -GM/160ML SOLN Take by mouth.     Multiple Vitamins-Minerals (MULTI-VITAMIN GUMMIES PO) Take by mouth. Vita-fusion     No current facility-administered medications on file prior to visit.    Review of Systems  Constitutional:  Negative for fever.  HENT:  Positive for congestion. Negative for ear pain (cloggged) and sinus pressure.   Eyes:  Negative for visual disturbance.  Respiratory:  Negative for cough, shortness of breath and wheezing.   Cardiovascular:  Negative for chest pain, palpitations and leg swelling.  Gastrointestinal:  Negative for abdominal pain, blood in stool, constipation and diarrhea.       No gerd  Genitourinary:  Negative for dysuria.  Musculoskeletal:  Positive for arthralgias (right knee). Negative for back pain.  Skin:  Negative for rash.  Neurological:  Negative for light-headedness and headaches.  Psychiatric/Behavioral:  Negative for dysphoric mood. The patient is nervous/anxious (situational).        Objective:   Vitals:   07/29/23 0943  BP: 122/80  Pulse: 69  Temp: 98.3 F (36.8 C)  SpO2: 99%   Filed Weights   07/29/23 0943  Weight: 178 lb (80.7 kg)   Body mass index is 27.88 kg/m.  BP Readings from Last 3 Encounters:  07/29/23 122/80  01/22/23 120/76  07/02/22 114/74    Wt Readings from Last 3 Encounters:  07/29/23 178 lb (80.7  kg)  01/22/23 188 lb (85.3 kg)  07/02/22 187 lb (84.8 kg)       Physical Exam Constitutional: She appears well-developed and well-nourished. No distress.  HENT:  Head: Normocephalic and atraumatic.  Right Ear: External ear normal. Normal ear canal and TM Left Ear: External ear normal.  Normal ear canal and TM Mouth/Throat: Oropharynx is clear and moist.  Eyes: Conjunctivae normal.  Neck: Neck supple. No tracheal deviation present. No thyromegaly present.  No carotid bruit  Cardiovascular: Normal rate, regular rhythm and normal heart sounds.   No murmur heard.  No edema. Pulmonary/Chest: Effort normal and breath sounds normal. No respiratory distress. She has no wheezes. She has no rales.  Breast: deferred   Abdominal: Soft. She exhibits no distension. There is no tenderness.  Lymphadenopathy: She has no cervical adenopathy.  Skin: Skin is warm and dry. She is not diaphoretic.  Psychiatric: She has a normal mood and affect. Her behavior is normal.     Lab Results  Component Value Date   WBC 6.8 07/27/2023   HGB 13.7 07/27/2023   HCT 41.6 07/27/2023   PLT 260.0 07/27/2023   GLUCOSE 99 07/27/2023   CHOL 217 (H) 07/27/2023   TRIG 147.0 07/27/2023   HDL 69.40 07/27/2023   LDLCALC 119 (H) 07/27/2023   ALT 26 07/27/2023   AST 22 07/27/2023   NA 140 07/27/2023   K 4.9 07/27/2023  CL 105 07/27/2023   CREATININE 0.81 07/27/2023   BUN 16 07/27/2023   CO2 29 07/27/2023   TSH 5.88 (H) 07/27/2023   HGBA1C 6.0 07/27/2023         Assessment & Plan:   Physical exam: Screening blood work  ordered Exercise  regular  Weight  ok for age Substance abuse  none   Reviewed recommended immunizations.   Health Maintenance  Topic Date Due   Cervical Cancer Screening (HPV/Pap Cotest)  07/08/2020   INFLUENZA VACCINE  02/12/2023   COVID-19 Vaccine (4 - 2024-25 season) 08/14/2023 (Originally 03/15/2023)   Zoster Vaccines- Shingrix (1 of 2) 10/27/2023 (Originally 03/12/1979)    MAMMOGRAM  08/30/2024   Colonoscopy  07/01/2026   DTaP/Tdap/Td (3 - Td or Tdap) 01/21/2033   HPV VACCINES  Aged Out   Hepatitis C Screening  Discontinued   HIV Screening  Discontinued          See Problem List for Assessment and Plan of chronic medical problems.

## 2023-07-26 NOTE — Patient Instructions (Addendum)
 Blood work was ordered.   Have this done in 4-6 weeks.  You do not have to fast.     Medications changes include :   None     Return in about 1 year (around 07/28/2024) for Physical Exam.   Health Maintenance, Female Adopting a healthy lifestyle and getting preventive care are important in promoting health and wellness. Ask your health care provider about: The right schedule for you to have regular tests and exams. Things you can do on your own to prevent diseases and keep yourself healthy. What should I know about diet, weight, and exercise? Eat a healthy diet  Eat a diet that includes plenty of vegetables, fruits, low-fat dairy products, and lean protein. Do not eat a lot of foods that are high in solid fats, added sugars, or sodium. Maintain a healthy weight Body mass index (BMI) is used to identify weight problems. It estimates body fat based on height and weight. Your health care provider can help determine your BMI and help you achieve or maintain a healthy weight. Get regular exercise Get regular exercise. This is one of the most important things you can do for your health. Most adults should: Exercise for at least 150 minutes each week. The exercise should increase your heart rate and make you sweat (moderate-intensity exercise). Do strengthening exercises at least twice a week. This is in addition to the moderate-intensity exercise. Spend less time sitting. Even light physical activity can be beneficial. Watch cholesterol and blood lipids Have your blood tested for lipids and cholesterol at 64 years of age, then have this test every 5 years. Have your cholesterol levels checked more often if: Your lipid or cholesterol levels are high. You are older than 64 years of age. You are at high risk for heart disease. What should I know about cancer screening? Depending on your health history and family history, you may need to have cancer screening at various ages. This  may include screening for: Breast cancer. Cervical cancer. Colorectal cancer. Skin cancer. Lung cancer. What should I know about heart disease, diabetes, and high blood pressure? Blood pressure and heart disease High blood pressure causes heart disease and increases the risk of stroke. This is more likely to develop in people who have high blood pressure readings or are overweight. Have your blood pressure checked: Every 3-5 years if you are 77-44 years of age. Every year if you are 26 years old or older. Diabetes Have regular diabetes screenings. This checks your fasting blood sugar level. Have the screening done: Once every three years after age 30 if you are at a normal weight and have a low risk for diabetes. More often and at a younger age if you are overweight or have a high risk for diabetes. What should I know about preventing infection? Hepatitis B If you have a higher risk for hepatitis B, you should be screened for this virus. Talk with your health care provider to find out if you are at risk for hepatitis B infection. Hepatitis C Testing is recommended for: Everyone born from [birth year] through 1966. Anyone with known risk factors for hepatitis C. Sexually transmitted infections (STIs) Get screened for STIs, including gonorrhea and chlamydia, if: You are sexually active and are younger than 64 years of age. You are older than 64 years of age and your health care provider tells you that you are at risk for this type of infection. Your sexual activity has changed since you  were last screened, and you are at increased risk for chlamydia or gonorrhea. Ask your health care provider if you are at risk. Ask your health care provider about whether you are at high risk for HIV. Your health care provider may recommend a prescription medicine to help prevent HIV infection. If you choose to take medicine to prevent HIV, you should first get tested for HIV. You should then be tested every 3  months for as long as you are taking the medicine. Pregnancy If you are about to stop having your period (premenopausal) and you may become pregnant, seek counseling before you get pregnant. Take 400 to 800 micrograms (mcg) of folic acid every day if you become pregnant. Ask for birth control (contraception) if you want to prevent pregnancy. Osteoporosis and menopause Osteoporosis is a disease in which the bones lose minerals and strength with aging. This can result in bone fractures. If you are 22 years old or older, or if you are at risk for osteoporosis and fractures, ask your health care provider if you should: Be screened for bone loss. Take a calcium  or vitamin D supplement to lower your risk of fractures. Be given hormone replacement therapy (HRT) to treat symptoms of menopause. Follow these instructions at home: Alcohol use Do not drink alcohol if: Your health care provider tells you not to drink. You are pregnant, may be pregnant, or are planning to become pregnant. If you drink alcohol: Limit how much you have to: 0-1 drink a day. Know how much alcohol is in your drink. In the [place] one drink equals one 12 oz bottle of beer (355 mL), one 5 oz glass of wine (148 mL), or one 1 oz glass of hard liquor (44 mL). Lifestyle Do not use any products that contain nicotine or tobacco. These products include cigarettes, chewing tobacco, and vaping devices, such as e-cigarettes. If you need help quitting, ask your health care provider. Do not use street drugs. Do not share needles. Ask your health care provider for help if you need support or information about quitting drugs. General instructions Schedule regular health, dental, and eye exams. Stay current with your vaccines. Tell your health care provider if: You often feel depressed. You have ever been abused or do not feel safe at home. Summary Adopting a healthy lifestyle and getting preventive care are important in promoting health  and wellness. Follow your health care provider's instructions about healthy diet, exercising, and getting tested or screened for diseases. Follow your health care provider's instructions on monitoring your cholesterol and blood pressure. This information is not intended to replace advice given to you by your health care provider. Make sure you discuss any questions you have with your health care provider. Document Revised: 11/19/2020 Document Reviewed: 11/19/2020 Elsevier Patient Education  2024 [employer].

## 2023-07-27 ENCOUNTER — Other Ambulatory Visit (INDEPENDENT_AMBULATORY_CARE_PROVIDER_SITE_OTHER): Payer: 59

## 2023-07-27 DIAGNOSIS — R739 Hyperglycemia, unspecified: Secondary | ICD-10-CM

## 2023-07-27 DIAGNOSIS — E782 Mixed hyperlipidemia: Secondary | ICD-10-CM

## 2023-07-27 LAB — LIPID PANEL
Cholesterol: 217 mg/dL — ABNORMAL HIGH (ref 0–200)
HDL: 69.4 mg/dL (ref >39.00)
LDL Cholesterol: 119 mg/dL — ABNORMAL HIGH (ref 0–99)
NonHDL: 148
Total CHOL/HDL Ratio: 3
Triglycerides: 147 mg/dL (ref 0.0–149.0)
VLDL: 29.4 mg/dL (ref 0.0–40.0)

## 2023-07-27 LAB — CBC WITH DIFFERENTIAL/PLATELET
Basophils Absolute: 0.1 10*3/uL (ref 0.0–0.1)
Basophils Relative: 0.9 % (ref 0.0–3.0)
Eosinophils Absolute: 0.2 10*3/uL (ref 0.0–0.7)
Eosinophils Relative: 2.7 % (ref 0.0–5.0)
HCT: 41.6 % (ref 36.0–46.0)
Hemoglobin: 13.7 g/dL (ref 12.0–15.0)
Lymphocytes Relative: 32.3 % (ref 12.0–46.0)
Lymphs Abs: 2.2 10*3/uL (ref 0.7–4.0)
MCHC: 32.8 g/dL (ref 30.0–36.0)
MCV: 91 fL (ref 78.0–100.0)
Monocytes Absolute: 0.7 10*3/uL (ref 0.1–1.0)
Monocytes Relative: 10 % (ref 3.0–12.0)
Neutro Abs: 3.7 10*3/uL (ref 1.4–7.7)
Neutrophils Relative %: 54.1 % (ref 43.0–77.0)
Platelets: 260 10*3/uL (ref 150.0–400.0)
RBC: 4.57 Mil/uL (ref 3.87–5.11)
RDW: 13.2 % (ref 11.5–15.5)
WBC: 6.8 10*3/uL (ref 4.0–10.5)

## 2023-07-27 LAB — COMPREHENSIVE METABOLIC PANEL
ALT: 26 U/L (ref 0–35)
AST: 22 U/L (ref 0–37)
Albumin: 4.7 g/dL (ref 3.5–5.2)
Alkaline Phosphatase: 59 U/L (ref 39–117)
BUN: 16 mg/dL (ref 6–23)
CO2: 29 meq/L (ref 19–32)
Calcium: 9.2 mg/dL (ref 8.4–10.5)
Chloride: 105 meq/L (ref 96–112)
Creatinine, Ser: 0.81 mg/dL (ref 0.40–1.20)
GFR: 77.28 mL/min (ref >60.00)
Glucose, Bld: 99 mg/dL (ref 70–99)
Potassium: 4.9 meq/L (ref 3.5–5.1)
Sodium: 140 meq/L (ref 135–145)
Total Bilirubin: 0.5 mg/dL (ref 0.2–1.2)
Total Protein: 6.8 g/dL (ref 6.0–8.3)

## 2023-07-27 LAB — TSH: TSH: 5.88 u[IU]/mL — ABNORMAL HIGH (ref 0.35–5.50)

## 2023-07-27 LAB — HEMOGLOBIN A1C: Hgb A1c MFr Bld: 6 % (ref 4.6–6.5)

## 2023-07-29 ENCOUNTER — Ambulatory Visit (INDEPENDENT_AMBULATORY_CARE_PROVIDER_SITE_OTHER): Payer: 59 | Admitting: Internal Medicine

## 2023-07-29 VITALS — BP 122/80 | HR 69 | Temp 98.3°F | Ht 67.0 in | Wt 178.0 lb

## 2023-07-29 DIAGNOSIS — R739 Hyperglycemia, unspecified: Secondary | ICD-10-CM

## 2023-07-29 DIAGNOSIS — E782 Mixed hyperlipidemia: Secondary | ICD-10-CM

## 2023-07-29 DIAGNOSIS — Z0000 Encounter for general adult medical examination without abnormal findings: Secondary | ICD-10-CM

## 2023-07-29 DIAGNOSIS — F419 Anxiety disorder, unspecified: Secondary | ICD-10-CM

## 2023-07-29 DIAGNOSIS — E038 Other specified hypothyroidism: Secondary | ICD-10-CM

## 2023-07-29 MED ORDER — FLUTICASONE PROPIONATE 50 MCG/ACT NA SUSP: 2.0000 | Freq: Every day | NASAL | 3 refills | Status: DC | PRN

## 2023-07-29 MED ORDER — ROSUVASTATIN CALCIUM 5 MG PO TABS: 5.0000 mg | ORAL_TABLET | Freq: Every day | ORAL | 3 refills | Status: AC

## 2023-07-29 MED ORDER — ALPRAZOLAM 0.25 MG PO TABS: 0.2500 mg | ORAL_TABLET | Freq: Two times a day (BID) | ORAL | 2 refills | Status: DC | PRN

## 2023-07-29 MED ORDER — ALLEGRA-D ALLERGY & CONGESTION 60-120 MG PO TB12: 1.0000 | ORAL_TABLET | Freq: Two times a day (BID) | ORAL | 1 refills | Status: AC

## 2023-07-29 NOTE — Assessment & Plan Note (Signed)
Chronic Situational Continue alprazolam 0.25 mg twice daily as needed

## 2023-07-29 NOTE — Assessment & Plan Note (Signed)
Chronic Regular exercise and healthy diet encouraged Check lipid panel, CMP, TSH Continue continue Crestor 5 mg daily

## 2023-07-29 NOTE — Assessment & Plan Note (Addendum)
 Chronic Sugars in prediabetic range Low sugar/carb diet Regular exercise Lab Results  Component Value Date   HGBA1C 6.0 07/27/2023

## 2023-07-31 ENCOUNTER — Encounter: Payer: Self-pay | Admitting: Internal Medicine

## 2023-07-31 DIAGNOSIS — H9190 Unspecified hearing loss, unspecified ear: Secondary | ICD-10-CM

## 2023-08-04 ENCOUNTER — Other Ambulatory Visit: Payer: Self-pay

## 2023-08-04 MED ORDER — ALPRAZOLAM 0.25 MG PO TABS: 0.2500 mg | ORAL_TABLET | Freq: Two times a day (BID) | ORAL | 2 refills | Status: DC | PRN

## 2023-09-09 ENCOUNTER — Other Ambulatory Visit: Payer: Self-pay | Admitting: Internal Medicine

## 2023-09-09 MED ORDER — ALPRAZOLAM 0.25 MG PO TABS: 0.2500 mg | ORAL_TABLET | Freq: Two times a day (BID) | ORAL | 2 refills | Status: AC | PRN

## 2024-02-10 ENCOUNTER — Encounter: Payer: Self-pay | Admitting: Internal Medicine

## 2024-04-24 ENCOUNTER — Encounter: Payer: Self-pay | Admitting: Internal Medicine

## 2024-04-24 NOTE — Progress Notes (Unsigned)
    Subjective:    Patient ID: Miranda Guerra, female    DOB: [birth year], 63 y.o.   MRN: [id number]      HPI Miranda Guerra is here for No chief complaint on file.       Medications and allergies reviewed with patient and updated if appropriate.  Current Outpatient Medications on File Prior to Visit  Medication Sig Dispense Refill   ALLEGRA -D ALLERGY  & CONGESTION 60-120 MG 12 hr tablet Take 1 tablet by mouth 2 (two) times daily. 180 tablet 1   ALPRAZolam  (XANAX ) 0.25 MG tablet Take 1 tablet (0.25 mg total) by mouth 2 (two) times daily as needed for anxiety. 30 tablet 2   Cholecalciferol (VITAMIN D) 50 MCG (2000 UT) CAPS Take by mouth.     CLENPIQ 10-3.5-12 MG-GM -GM/160ML SOLN Take by mouth.     fluticasone  (FLONASE ) 50 MCG/ACT nasal spray Place 2 sprays into both nostrils daily as needed for allergies or rhinitis. SPRAY 2 SPRAYS INTO EACH NOSTRIL EVERY DAY 48 mL 3   Multiple Vitamins-Minerals (MULTI-VITAMIN GUMMIES PO) Take by mouth. Vita-fusion     rosuvastatin  (CRESTOR ) 5 MG tablet Take 1 tablet (5 mg total) by mouth daily. 90 tablet 3   No current facility-administered medications on file prior to visit.    Review of Systems     Objective:  There were no vitals filed for this visit. BP Readings from Last 3 Encounters:  07/29/23 122/80  01/22/23 120/76  07/02/22 114/74   Wt Readings from Last 3 Encounters:  07/29/23 178 lb (80.7 kg)  01/22/23 188 lb (85.3 kg)  07/02/22 187 lb (84.8 kg)   There is no height or weight on file to calculate BMI.    Physical Exam         Assessment & Plan:    See Problem List for Assessment and Plan of chronic medical problems.

## 2024-04-25 ENCOUNTER — Telehealth: Payer: Self-pay

## 2024-04-25 ENCOUNTER — Ambulatory Visit (INDEPENDENT_AMBULATORY_CARE_PROVIDER_SITE_OTHER): Admitting: Internal Medicine

## 2024-04-25 ENCOUNTER — Encounter: Payer: Self-pay | Admitting: Internal Medicine

## 2024-04-25 VITALS — BP 120/76 | HR 68 | Temp 98.1°F | Ht 67.0 in | Wt 193.0 lb

## 2024-04-25 DIAGNOSIS — E66811 Obesity, class 1: Secondary | ICD-10-CM | POA: Insufficient documentation

## 2024-04-25 DIAGNOSIS — R7303 Prediabetes: Secondary | ICD-10-CM

## 2024-04-25 DIAGNOSIS — E663 Overweight: Secondary | ICD-10-CM | POA: Insufficient documentation

## 2024-04-25 DIAGNOSIS — E669 Obesity, unspecified: Secondary | ICD-10-CM | POA: Insufficient documentation

## 2024-04-25 MED ORDER — ZEPBOUND 2.5 MG/0.5ML ~~LOC~~ SOAJ: 2.5000 mg | SUBCUTANEOUS | 0 refills | Status: DC

## 2024-04-25 NOTE — Assessment & Plan Note (Addendum)
 Chronic With prediabetes, hyperlipidemia She is exercising regularly - cardio, weights and at goal for at least 150 minutes per week She is eating healthy but likely consuming too many calories -- discussed decreasing caloric intake Discussed weight loss medication - pros and cons and possible side effects Will try zepbound 2.5 mg weekly We both agree this should be a short term medication - continue exercise and healthy diet  - decrease portions/calorie intake

## 2024-04-25 NOTE — Assessment & Plan Note (Signed)
 Chronic Sugars in prediabetic range Would benefit from weight loss Low sugar/carb diet Regular exercise  Lab Results  Component Value Date   HGBA1C 6.0 07/27/2023

## 2024-04-25 NOTE — Patient Instructions (Signed)
       Medications changes include :   zepbound 2.5mg  weekly

## 2024-04-26 ENCOUNTER — Other Ambulatory Visit (HOSPITAL_COMMUNITY): Payer: Self-pay

## 2024-04-26 ENCOUNTER — Telehealth: Payer: Self-pay | Admitting: Pharmacy Technician

## 2024-04-26 MED ORDER — WEGOVY 0.25 MG/0.5ML ~~LOC~~ SOAJ: 0.2500 mg | SUBCUTANEOUS | 0 refills | Status: DC

## 2024-04-26 NOTE — Telephone Encounter (Addendum)
 Pharmacy Patient Advocate Encounter   Received notification from CoverMyMeds that prior authorization for Zepbound 2.5MG /0.5ML pen-injectors is required/requested.   Insurance verification completed.   The patient is insured through [facility] Key: [id number]   Per test claim:  WEGOVY is preferred by the insurance.  If suggested medication is appropriate, Please send in a new RX and discontinue this one. If not, please advise as to why it's not appropriate so that we may request a Prior Authorization. Please note, some preferred medications may still require a PA.  If the suggested medications have not been trialed and there are no contraindications to their use, the PA will not be submitted, as it will not be approved.   [facility] preferred unless zepbound is medically necessary.**

## 2024-04-26 NOTE — Telephone Encounter (Signed)
 [facility] sent to pharmacy

## 2024-04-27 ENCOUNTER — Telehealth: Payer: Self-pay | Admitting: Pharmacy Technician

## 2024-04-27 ENCOUNTER — Other Ambulatory Visit (HOSPITAL_COMMUNITY): Payer: Self-pay

## 2024-04-27 NOTE — Telephone Encounter (Signed)
 Pharmacy Patient Advocate Encounter  Received notification from [facility] that Prior Authorization for [facility] 0.25MG /0.5ML auto-injectors  has been DENIED.  Full denial letter will be uploaded to the media tab. See denial reason below.  **This insurance requires the BMI to be at least 35kg/m2**    PA #/Case ID/Reference #: [id number]

## 2024-04-27 NOTE — Telephone Encounter (Signed)
 Pharmacy Patient Advocate Encounter   Received notification from Pt Calls Messages that prior authorization for Wegovy 0.25MG /0.5ML auto-injectors  is required/requested.   Insurance verification completed.   The patient is insured through [facility].   Per test claim: PA required; PA submitted to above mentioned insurance via Latent Key/confirmation #/EOC [id number] Status is pending

## 2024-04-28 NOTE — Telephone Encounter (Signed)
Patient has been made aware of denial.

## 2024-04-29 ENCOUNTER — Other Ambulatory Visit (HOSPITAL_COMMUNITY): Payer: Self-pay

## 2024-04-29 ENCOUNTER — Telehealth: Payer: Self-pay

## 2024-04-29 MED ORDER — LIRAGLUTIDE -WEIGHT MANAGEMENT 18 MG/3ML ~~LOC~~ SOPN: PEN_INJECTOR | SUBCUTANEOUS | 2 refills | Status: DC

## 2024-04-29 NOTE — Telephone Encounter (Signed)
 Pharmacy Patient Advocate Encounter   Received notification from CoverMyMeds that prior authorization for Liraglutide -Weight Management 18MG /3ML pen-injectors [facility]) is required/requested.   Insurance verification completed.   The patient is insured through [facility].   Per test claim: PA required; PA submitted to above mentioned insurance via Latent Key/confirmation #/EOC [id number] Status is pending

## 2024-05-03 ENCOUNTER — Other Ambulatory Visit (HOSPITAL_COMMUNITY): Payer: Self-pay

## 2024-05-03 NOTE — Telephone Encounter (Signed)
 Noted-MyChart message sent to patient-no injectable weight loss medication will be covered unless BMI is more than 35.

## 2024-05-03 NOTE — Telephone Encounter (Signed)
 Pharmacy Patient Advocate Encounter  Received notification from [facility] that Prior Authorization for Liraglutide -Weight Management 18MG /3ML pen-injectors   has been DENIED.  See denial reason below. No denial letter attached in CMM. Will attach denial letter to Media tab once received. DENIAL OUTCOME Your plan only covers this drug when it is used for certain health conditions. Covered uses are when you have: A) An initial body mass index (BMI) of at least 35 kg/m2. Your plan does not cover this drug for your health condition that your doctor told us  you have.   PA #/Case ID/Reference #: [id number]

## 2024-05-13 ENCOUNTER — Other Ambulatory Visit (HOSPITAL_COMMUNITY): Payer: Self-pay

## 2024-06-15 NOTE — Telephone Encounter (Signed)
 PA denied.

## 2024-07-08 ENCOUNTER — Other Ambulatory Visit (HOSPITAL_COMMUNITY): Payer: Self-pay

## 2024-07-11 ENCOUNTER — Other Ambulatory Visit (HOSPITAL_COMMUNITY): Payer: Self-pay

## 2024-07-20 ENCOUNTER — Encounter: Payer: Self-pay | Admitting: Internal Medicine

## 2024-07-21 ENCOUNTER — Other Ambulatory Visit: Payer: Self-pay

## 2024-07-21 DIAGNOSIS — E782 Mixed hyperlipidemia: Secondary | ICD-10-CM

## 2024-07-21 DIAGNOSIS — R7303 Prediabetes: Secondary | ICD-10-CM

## 2024-07-21 DIAGNOSIS — E038 Other specified hypothyroidism: Secondary | ICD-10-CM

## 2024-07-21 DIAGNOSIS — R739 Hyperglycemia, unspecified: Secondary | ICD-10-CM

## 2024-07-21 DIAGNOSIS — Z0000 Encounter for general adult medical examination without abnormal findings: Secondary | ICD-10-CM

## 2024-07-21 DIAGNOSIS — E66811 Obesity, class 1: Secondary | ICD-10-CM

## 2024-07-24 ENCOUNTER — Other Ambulatory Visit: Payer: Self-pay | Admitting: Internal Medicine

## 2024-07-26 ENCOUNTER — Other Ambulatory Visit

## 2024-07-26 DIAGNOSIS — E038 Other specified hypothyroidism: Secondary | ICD-10-CM

## 2024-07-26 DIAGNOSIS — E782 Mixed hyperlipidemia: Secondary | ICD-10-CM | POA: Diagnosis not present

## 2024-07-26 DIAGNOSIS — R7303 Prediabetes: Secondary | ICD-10-CM | POA: Diagnosis not present

## 2024-07-26 LAB — LIPID PANEL
Cholesterol: 206 mg/dL — ABNORMAL HIGH (ref 28–200)
HDL: 60.7 mg/dL
LDL Cholesterol: 116 mg/dL — ABNORMAL HIGH (ref 10–99)
NonHDL: 144.99
Total CHOL/HDL Ratio: 3
Triglycerides: 145 mg/dL (ref 10.0–149.0)
VLDL: 29 mg/dL (ref 0.0–40.0)

## 2024-07-26 LAB — CBC WITH DIFFERENTIAL/PLATELET
Basophils Absolute: 0.1 K/uL (ref 0.0–0.1)
Basophils Relative: 1 % (ref 0.0–3.0)
Eosinophils Absolute: 0.2 K/uL (ref 0.0–0.7)
Eosinophils Relative: 2.8 % (ref 0.0–5.0)
HCT: 40.8 % (ref 36.0–46.0)
Hemoglobin: 13.7 g/dL (ref 12.0–15.0)
Lymphocytes Relative: 38.2 % (ref 12.0–46.0)
Lymphs Abs: 2.1 K/uL (ref 0.7–4.0)
MCHC: 33.6 g/dL (ref 30.0–36.0)
MCV: 88.8 fl (ref 78.0–100.0)
Monocytes Absolute: 0.5 K/uL (ref 0.1–1.0)
Monocytes Relative: 8.5 % (ref 3.0–12.0)
Neutro Abs: 2.7 K/uL (ref 1.4–7.7)
Neutrophils Relative %: 49.5 % (ref 43.0–77.0)
Platelets: 248 K/uL (ref 150.0–400.0)
RBC: 4.59 Mil/uL (ref 3.87–5.11)
RDW: 13.1 % (ref 11.5–15.5)
WBC: 5.5 K/uL (ref 4.0–10.5)

## 2024-07-26 LAB — COMPREHENSIVE METABOLIC PANEL WITH GFR
ALT: 24 U/L (ref 3–35)
AST: 16 U/L (ref 5–37)
Albumin: 4.6 g/dL (ref 3.5–5.2)
Alkaline Phosphatase: 61 U/L (ref 39–117)
BUN: 18 mg/dL (ref 6–23)
CO2: 29 meq/L (ref 19–32)
Calcium: 9.5 mg/dL (ref 8.4–10.5)
Chloride: 106 meq/L (ref 96–112)
Creatinine, Ser: 0.91 mg/dL (ref 0.40–1.20)
GFR: 66.73 mL/min
Glucose, Bld: 98 mg/dL (ref 70–99)
Potassium: 5.5 meq/L — ABNORMAL HIGH (ref 3.5–5.1)
Sodium: 142 meq/L (ref 135–145)
Total Bilirubin: 0.5 mg/dL (ref 0.2–1.2)
Total Protein: 7.3 g/dL (ref 6.0–8.3)

## 2024-07-26 LAB — T4, FREE: Free T4: 0.68 ng/dL (ref 0.60–1.60)

## 2024-07-26 LAB — TSH: TSH: 3.57 u[IU]/mL (ref 0.35–5.50)

## 2024-07-26 LAB — HEMOGLOBIN A1C: Hgb A1c MFr Bld: 5.7 % (ref 4.6–6.5)

## 2024-07-27 NOTE — Progress Notes (Signed)
 "   Subjective:    Patient ID: Miranda Guerra, female    DOB: 12-25-1960, 64 y.o.   MRN: [id number]      HPI Miranda Guerra is here for a Physical exam and her chronic medical problems.    Getting Semaglutide  - through  [facility] [place]  - started beginning of December.  Dec appetite.        Medications and allergies reviewed with patient and updated if appropriate.  Medications Ordered Prior to Encounter[1]  Review of Systems  Constitutional:  Negative for fever.  Eyes:  Negative for visual disturbance.  Respiratory:  Negative for cough, shortness of breath and wheezing.   Cardiovascular:  Negative for chest pain, palpitations and leg swelling.  Gastrointestinal:  Negative for abdominal pain, blood in stool, constipation and diarrhea.       No gerd  Genitourinary:  Negative for dysuria.  Musculoskeletal:  Negative for arthralgias and back pain.  Skin:  Negative for rash.  Neurological:  Negative for light-headedness and headaches.  Psychiatric/Behavioral:  Negative for dysphoric mood. The patient is nervous/anxious (situational).        Objective:   Vitals:   07/28/24 1012  BP: 112/72  Pulse: 72  Temp: 98.2 F (36.8 C)  SpO2: 95%   Filed Weights   07/28/24 1012  Weight: 190 lb (86.2 kg)   Body mass index is 29.76 kg/m.  BP Readings from Last 3 Encounters:  07/28/24 112/72  04/25/24 120/76  07/29/23 122/80    Wt Readings from Last 3 Encounters:  07/28/24 190 lb (86.2 kg)  04/25/24 193 lb (87.5 kg)  07/29/23 178 lb (80.7 kg)       Physical Exam Constitutional: She appears well-developed and well-nourished. No distress.  HENT:  Head: Normocephalic and atraumatic.  Right Ear: External ear normal. Normal ear canal and TM Left Ear: External ear normal.  Normal ear canal and TM Mouth/Throat: Oropharynx is clear and moist.  Eyes: Conjunctivae normal.  Neck: Neck supple. No tracheal deviation present. No thyromegaly present.  No carotid bruit  Cardiovascular:  Normal rate, regular rhythm and normal heart sounds.   No murmur heard.  No edema. Pulmonary/Chest: Effort normal and breath sounds normal. No respiratory distress. She has no wheezes. She has no rales.  Breast: deferred   Abdominal: Soft. She exhibits no distension. There is no tenderness.  Lymphadenopathy: She has no cervical adenopathy.  Skin: Skin is warm and dry. She is not diaphoretic.  Psychiatric: She has a normal mood and affect. Her behavior is normal.     Lab Results  Component Value Date   WBC 5.5 07/26/2024   HGB 13.7 07/26/2024   HCT 40.8 07/26/2024   PLT 248.0 07/26/2024   GLUCOSE 98 07/26/2024   CHOL 206 (H) 07/26/2024   TRIG 145.0 07/26/2024   HDL 60.70 07/26/2024   LDLCALC 116 (H) 07/26/2024   ALT 24 07/26/2024   AST 16 07/26/2024   NA 142 07/26/2024   K 5.5 No hemolysis seen (H) 07/26/2024   CL 106 07/26/2024   CREATININE 0.91 07/26/2024   BUN 18 07/26/2024   CO2 29 07/26/2024   TSH 3.57 07/26/2024   HGBA1C 5.7 07/26/2024         Assessment & Plan:   Physical exam: Screening blood work  ordered Exercise  walks dog every day for 30 min, gym M, F Weight  overweight - on semaglutide  Diet - eating a good amount of protein Substance abuse  none   Reviewed recommended immunizations.  Health Maintenance  Topic Date Due   Cervical Cancer Screening (HPV/Pap Cotest)  07/08/2020   Mammogram  08/30/2024   COVID-19 Vaccine (4 - 2025-26 season) 08/12/2024 (Originally 03/14/2024)   Influenza Vaccine  10/11/2024 (Originally 02/12/2024)   Zoster Vaccines- Shingrix (1 of 2) 10/26/2024 (Originally 03/11/2010)   Pneumococcal Vaccine: 50+ Years (1 of 1 - PCV) 07/28/2025 (Originally 03/11/2010)   Colonoscopy  07/01/2026   DTaP/Tdap/Td (3 - Td or Tdap) 01/21/2033   HPV VACCINES (No Doses Required) Completed   Hepatitis B Vaccines 19-59 Average Risk  Aged Out   Meningococcal B Vaccine  Aged Out   Hepatitis C Screening  Discontinued   HIV Screening  Discontinued           See Problem List for Assessment and Plan of chronic medical problems.         [1]  Current Outpatient Medications on File Prior to Visit  Medication Sig Dispense Refill   ALLEGRA -D ALLERGY  & CONGESTION 60-120 MG 12 hr tablet Take 1 tablet by mouth 2 (two) times daily. 180 tablet 1   ALPRAZolam  (XANAX ) 0.25 MG tablet Take 1 tablet (0.25 mg total) by mouth 2 (two) times daily as needed for anxiety. 30 tablet 2   Cholecalciferol (VITAMIN D) 50 MCG (2000 UT) CAPS Take by mouth.     CLENPIQ 10-3.5-12 MG-GM -GM/160ML SOLN Take by mouth.     fluticasone  (FLONASE ) 50 MCG/ACT nasal spray PLACE 2 SPRAYS INTO BOTH NOSTRILS DAILY AS NEEDED FOR ALLERGIES OR RHINITIS. 48 mL 3   MAGNESIUM GLYCINATE ADVANCED PO Take by mouth.     Multiple Vitamins-Minerals (MULTI-VITAMIN GUMMIES PO) Take by mouth. Vita-fusion     rosuvastatin  (CRESTOR ) 5 MG tablet Take 1 tablet (5 mg total) by mouth daily. 90 tablet 3   SEMAGLUTIDE  PO Take 10 Units by mouth once a week.     No current facility-administered medications on file prior to visit.   "

## 2024-07-27 NOTE — Patient Instructions (Addendum)
 "     Blood work was ordered.       Medications changes include :   None     Return in about 1 year (around 07/28/2025).    Health Maintenance, Female Adopting a healthy lifestyle and getting preventive care are important in promoting health and wellness. Ask your health care provider about: The right schedule for you to have regular tests and exams. Things you can do on your own to prevent diseases and keep yourself healthy. What should I know about diet, weight, and exercise? Eat a healthy diet  Eat a diet that includes plenty of vegetables, fruits, low-fat dairy products, and lean protein. Do not eat a lot of foods that are high in solid fats, added sugars, or sodium. Maintain a healthy weight Body mass index (BMI) is used to identify weight problems. It estimates body fat based on height and weight. Your health care provider can help determine your BMI and help you achieve or maintain a healthy weight. Get regular exercise Get regular exercise. This is one of the most important things you can do for your health. Most adults should: Exercise for at least 150 minutes each week. The exercise should increase your heart rate and make you sweat (moderate-intensity exercise). Do strengthening exercises at least twice a week. This is in addition to the moderate-intensity exercise. Spend less time sitting. Even light physical activity can be beneficial. Watch cholesterol and blood lipids Have your blood tested for lipids and cholesterol at 65 years of age, then have this test every 5 years. Have your cholesterol levels checked more often if: Your lipid or cholesterol levels are high. You are older than 65 years of age. You are at high risk for heart disease. What should I know about cancer screening? Depending on your health history and family history, you may need to have cancer screening at various ages. This may include screening for: Breast cancer. Cervical cancer. Colorectal  cancer. Skin cancer. Lung cancer. What should I know about heart disease, diabetes, and high blood pressure? Blood pressure and heart disease High blood pressure causes heart disease and increases the risk of stroke. This is more likely to develop in people who have high blood pressure readings or are overweight. Have your blood pressure checked: Every 3-5 years if you are 72-46 years of age. Every year if you are 51 years old or older. Diabetes Have regular diabetes screenings. This checks your fasting blood sugar level. Have the screening done: Once every three years after age 37 if you are at a normal weight and have a low risk for diabetes. More often and at a younger age if you are overweight or have a high risk for diabetes. What should I know about preventing infection? Hepatitis B If you have a higher risk for hepatitis B, you should be screened for this virus. Talk with your health care provider to find out if you are at risk for hepatitis B infection. Hepatitis C Testing is recommended for: Everyone born from [birth year] through 1966. Anyone with known risk factors for hepatitis C. Sexually transmitted infections (STIs) Get screened for STIs, including gonorrhea and chlamydia, if: You are sexually active and are younger than 65 years of age. You are older than 65 years of age and your health care provider tells you that you are at risk for this type of infection. Your sexual activity has changed since you were last screened, and you are at increased risk for chlamydia or gonorrhea. Ask  your health care provider if you are at risk. Ask your health care provider about whether you are at high risk for HIV. Your health care provider may recommend a prescription medicine to help prevent HIV infection. If you choose to take medicine to prevent HIV, you should first get tested for HIV. You should then be tested every 3 months for as long as you are taking the medicine. Pregnancy If you are  about to stop having your period (premenopausal) and you may become pregnant, seek counseling before you get pregnant. Take 400 to 800 micrograms (mcg) of folic acid every day if you become pregnant. Ask for birth control (contraception) if you want to prevent pregnancy. Osteoporosis and menopause Osteoporosis is a disease in which the bones lose minerals and strength with aging. This can result in bone fractures. If you are 71 years old or older, or if you are at risk for osteoporosis and fractures, ask your health care provider if you should: Be screened for bone loss. Take a calcium  or vitamin D supplement to lower your risk of fractures. Be given hormone replacement therapy (HRT) to treat symptoms of menopause. Follow these instructions at home: Alcohol use Do not drink alcohol if: Your health care provider tells you not to drink. You are pregnant, may be pregnant, or are planning to become pregnant. If you drink alcohol: Limit how much you have to: 0-1 drink a day. Know how much alcohol is in your drink. In the [place] one drink equals one 12 oz bottle of beer (355 mL), one 5 oz glass of wine (148 mL), or one 1 oz glass of hard liquor (44 mL). Lifestyle Do not use any products that contain nicotine or tobacco. These products include cigarettes, chewing tobacco, and vaping devices, such as e-cigarettes. If you need help quitting, ask your health care provider. Do not use street drugs. Do not share needles. Ask your health care provider for help if you need support or information about quitting drugs. General instructions Schedule regular health, dental, and eye exams. Stay current with your vaccines. Tell your health care provider if: You often feel depressed. You have ever been abused or do not feel safe at home. Summary Adopting a healthy lifestyle and getting preventive care are important in promoting health and wellness. Follow your health care provider's instructions about  healthy diet, exercising, and getting tested or screened for diseases. Follow your health care provider's instructions on monitoring your cholesterol and blood pressure. This information is not intended to replace advice given to you by your health care provider. Make sure you discuss any questions you have with your health care provider. Document Revised: 11/19/2020 Document Reviewed: 11/19/2020 [employer] Patient Education  2024 [employer]Health Maintenance, Female Adopting a healthy lifestyle and getting preventive care are important in promoting health and wellness. Ask your health care provider about: The right schedule for you to have regular tests and exams. Things you can do on your own to prevent diseases and keep yourself healthy. What should I know about diet, weight, and exercise? Eat a healthy diet  Eat a diet that includes plenty of vegetables, fruits, low-fat dairy products, and lean protein. Do not eat a lot of foods that are high in solid fats, added sugars, or sodium. Maintain a healthy weight Body mass index (BMI) is used to identify weight problems. It estimates body fat based on height and weight. Your health care provider can help determine your BMI and help you achieve or maintain a  healthy weight. Get regular exercise Get regular exercise. This is one of the most important things you can do for your health. Most adults should: Exercise for at least 150 minutes each week. The exercise should increase your heart rate and make you sweat (moderate-intensity exercise). Do strengthening exercises at least twice a week. This is in addition to the moderate-intensity exercise. Spend less time sitting. Even light physical activity can be beneficial. Watch cholesterol and blood lipids Have your blood tested for lipids and cholesterol at 65 years of age, then have this test every 5 years. Have your cholesterol levels checked more often if: Your lipid or cholesterol levels are  high. You are older than 65 years of age. You are at high risk for heart disease. What should I know about cancer screening? Depending on your health history and family history, you may need to have cancer screening at various ages. This may include screening for: Breast cancer. Cervical cancer. Colorectal cancer. Skin cancer. Lung cancer. What should I know about heart disease, diabetes, and high blood pressure? Blood pressure and heart disease High blood pressure causes heart disease and increases the risk of stroke. This is more likely to develop in people who have high blood pressure readings or are overweight. Have your blood pressure checked: Every 3-5 years if you are 31-49 years of age. Every year if you are 76 years old or older. Diabetes Have regular diabetes screenings. This checks your fasting blood sugar level. Have the screening done: Once every three years after age 18 if you are at a normal weight and have a low risk for diabetes. More often and at a younger age if you are overweight or have a high risk for diabetes. What should I know about preventing infection? Hepatitis B If you have a higher risk for hepatitis B, you should be screened for this virus. Talk with your health care provider to find out if you are at risk for hepatitis B infection. Hepatitis C Testing is recommended for: Everyone born from [birth year] through 1966. Anyone with known risk factors for hepatitis C. Sexually transmitted infections (STIs) Get screened for STIs, including gonorrhea and chlamydia, if: You are sexually active and are younger than 65 years of age. You are older than 65 years of age and your health care provider tells you that you are at risk for this type of infection. Your sexual activity has changed since you were last screened, and you are at increased risk for chlamydia or gonorrhea. Ask your health care provider if you are at risk. Ask your health care provider about whether you  are at high risk for HIV. Your health care provider may recommend a prescription medicine to help prevent HIV infection. If you choose to take medicine to prevent HIV, you should first get tested for HIV. You should then be tested every 3 months for as long as you are taking the medicine. Pregnancy If you are about to stop having your period (premenopausal) and you may become pregnant, seek counseling before you get pregnant. Take 400 to 800 micrograms (mcg) of folic acid every day if you become pregnant. Ask for birth control (contraception) if you want to prevent pregnancy. Osteoporosis and menopause Osteoporosis is a disease in which the bones lose minerals and strength with aging. This can result in bone fractures. If you are 76 years old or older, or if you are at risk for osteoporosis and fractures, ask your health care provider if you should: Be screened  for bone loss. Take a calcium  or vitamin D supplement to lower your risk of fractures. Be given hormone replacement therapy (HRT) to treat symptoms of menopause. Follow these instructions at home: Alcohol use Do not drink alcohol if: Your health care provider tells you not to drink. You are pregnant, may be pregnant, or are planning to become pregnant. If you drink alcohol: Limit how much you have to: 0-1 drink a day. Know how much alcohol is in your drink. In the [place] one drink equals one 12 oz bottle of beer (355 mL), one 5 oz glass of wine (148 mL), or one 1 oz glass of hard liquor (44 mL). Lifestyle Do not use any products that contain nicotine or tobacco. These products include cigarettes, chewing tobacco, and vaping devices, such as e-cigarettes. If you need help quitting, ask your health care provider. Do not use street drugs. Do not share needles. Ask your health care provider for help if you need support or information about quitting drugs. General instructions Schedule regular health, dental, and eye exams. Stay current  with your vaccines. Tell your health care provider if: You often feel depressed. You have ever been abused or do not feel safe at home. Summary Adopting a healthy lifestyle and getting preventive care are important in promoting health and wellness. Follow your health care provider's instructions about healthy diet, exercising, and getting tested or screened for diseases. Follow your health care provider's instructions on monitoring your cholesterol and blood pressure. This information is not intended to replace advice given to you by your health care provider. Make sure you discuss any questions you have with your health care provider. Document Revised: 11/19/2020 Document Reviewed: 11/19/2020 Elsevier Patient Education  2024 [employer]. "

## 2024-07-28 ENCOUNTER — Encounter: Payer: Self-pay | Admitting: Internal Medicine

## 2024-07-28 ENCOUNTER — Ambulatory Visit: Payer: 59 | Admitting: Internal Medicine

## 2024-07-28 VITALS — BP 112/72 | HR 72 | Temp 98.2°F | Ht 67.0 in | Wt 190.0 lb

## 2024-07-28 DIAGNOSIS — E663 Overweight: Secondary | ICD-10-CM

## 2024-07-28 DIAGNOSIS — E875 Hyperkalemia: Secondary | ICD-10-CM | POA: Diagnosis not present

## 2024-07-28 DIAGNOSIS — F419 Anxiety disorder, unspecified: Secondary | ICD-10-CM | POA: Diagnosis not present

## 2024-07-28 DIAGNOSIS — R7303 Prediabetes: Secondary | ICD-10-CM | POA: Diagnosis not present

## 2024-07-28 DIAGNOSIS — E66811 Obesity, class 1: Secondary | ICD-10-CM

## 2024-07-28 DIAGNOSIS — E7800 Pure hypercholesterolemia, unspecified: Secondary | ICD-10-CM | POA: Diagnosis not present

## 2024-07-28 DIAGNOSIS — Z0000 Encounter for general adult medical examination without abnormal findings: Secondary | ICD-10-CM

## 2024-07-28 NOTE — Assessment & Plan Note (Signed)
 Chronic Regular exercise and healthy diet encouraged Lab Results  Component Value Date   LDLCALC 116 (H) 07/26/2024    Ct cac is 0 Continue continue Crestor  5 mg daily

## 2024-07-28 NOTE — Assessment & Plan Note (Signed)
Chronic Situational Continue alprazolam 0.25 mg twice daily as needed

## 2024-07-28 NOTE — Assessment & Plan Note (Signed)
 Chronic Sugars in prediabetic range Low sugar/carb diet Regular exercise  Lab Results  Component Value Date   HGBA1C 5.7 07/26/2024

## 2024-07-28 NOTE — Assessment & Plan Note (Addendum)
 Chronic On low dose semaglutide  low dose With prediabetes, hyperlipidemia She is exercising regularly - cardio, weights  She is eating healthy -continue increased protein, good water intake

## 2024-07-30 ENCOUNTER — Ambulatory Visit: Payer: Self-pay | Admitting: Internal Medicine

## 2025-08-04 ENCOUNTER — Encounter: Admitting: Internal Medicine
